# Patient Record
Sex: Female | Born: 1960 | Race: White | Hispanic: No | Marital: Married
Health system: Southern US, Community
[De-identification: ages and names within clinical notes are randomized; demographics above are authoritative.]

---

## 2013-03-29 ENCOUNTER — Inpatient Hospital Stay: Payer: Self-pay | Admitting: Internal Medicine

## 2013-03-29 LAB — URINALYSIS, COMPLETE
Blood: NEGATIVE
Nitrite: NEGATIVE
Ph: 5 (ref 4.5–8.0)
Specific Gravity: 1.025 (ref 1.003–1.030)

## 2013-03-29 LAB — CBC
HCT: 37.1 % (ref 35.0–47.0)
MCH: 41.3 pg — ABNORMAL HIGH (ref 26.0–34.0)
MCHC: 35.2 g/dL (ref 32.0–36.0)
MCV: 117 fL — ABNORMAL HIGH (ref 80–100)
RBC: 3.16 10*6/uL — ABNORMAL LOW (ref 3.80–5.20)
RDW: 13.1 % (ref 11.5–14.5)
WBC: 8.5 10*3/uL (ref 3.6–11.0)

## 2013-03-29 LAB — ETHANOL: Ethanol %: 0.01 % (ref 0.000–0.080)

## 2013-03-29 LAB — COMPREHENSIVE METABOLIC PANEL
Albumin: 2.8 g/dL — ABNORMAL LOW (ref 3.4–5.0)
Alkaline Phosphatase: 380 U/L — ABNORMAL HIGH (ref 50–136)
Anion Gap: 15 (ref 7–16)
Bilirubin,Total: 2.8 mg/dL — ABNORMAL HIGH (ref 0.2–1.0)
Calcium, Total: 8.4 mg/dL — ABNORMAL LOW (ref 8.5–10.1)
Chloride: 96 mmol/L — ABNORMAL LOW (ref 98–107)
Creatinine: 0.77 mg/dL (ref 0.60–1.30)
EGFR (African American): >60
EGFR (Non-African Amer.): >60
Osmolality: 272 (ref 275–301)
Potassium: 2.9 mmol/L — ABNORMAL LOW (ref 3.5–5.1)
SGPT (ALT): 115 U/L — ABNORMAL HIGH (ref 12–78)
Total Protein: 6.8 g/dL (ref 6.4–8.2)

## 2013-03-29 LAB — LIPASE, BLOOD: Lipase: 1138 U/L — ABNORMAL HIGH (ref 73–393)

## 2013-03-29 LAB — MAGNESIUM: Magnesium: 1.4 mg/dL — ABNORMAL LOW

## 2013-03-30 LAB — COMPREHENSIVE METABOLIC PANEL
Alkaline Phosphatase: 252 U/L — ABNORMAL HIGH (ref 50–136)
Anion Gap: 8 (ref 7–16)
Bilirubin,Total: 2.9 mg/dL — ABNORMAL HIGH (ref 0.2–1.0)
Calcium, Total: 7.3 mg/dL — ABNORMAL LOW (ref 8.5–10.1)
EGFR (African American): >60
Glucose: 82 mg/dL (ref 65–99)
SGPT (ALT): 72 U/L (ref 12–78)

## 2013-03-30 LAB — CBC WITH DIFFERENTIAL/PLATELET
Basophil #: 0 10*3/uL (ref 0.0–0.1)
Eosinophil #: 0.2 10*3/uL (ref 0.0–0.7)
Eosinophil %: 3.1 %
MCH: 42.5 pg — ABNORMAL HIGH (ref 26.0–34.0)
MCV: 120 fL — ABNORMAL HIGH (ref 80–100)
Monocyte %: 5.9 %
Neutrophil %: 66.5 %
Platelet: 134 10*3/uL — ABNORMAL LOW (ref 150–440)
RDW: 13.1 % (ref 11.5–14.5)

## 2013-03-30 LAB — OCCULT BLOOD X 1 CARD TO LAB, STOOL: Occult Blood, Feces: POSITIVE

## 2013-03-30 LAB — MAGNESIUM: Magnesium: 2.8 mg/dL — ABNORMAL HIGH

## 2013-03-31 LAB — CBC WITH DIFFERENTIAL/PLATELET
Basophil %: 0.5 %
Eosinophil #: 0.2 10*3/uL (ref 0.0–0.7)
Eosinophil %: 4.3 %
HCT: 28.6 % — ABNORMAL LOW (ref 35.0–47.0)
HGB: 9.8 g/dL — ABNORMAL LOW (ref 12.0–16.0)
Lymphocyte #: 1.1 10*3/uL (ref 1.0–3.6)
MCH: 42 pg — ABNORMAL HIGH (ref 26.0–34.0)
MCHC: 34.2 g/dL (ref 32.0–36.0)
MCV: 123 fL — ABNORMAL HIGH (ref 80–100)
Monocyte #: 0.2 x10 3/mm (ref 0.2–0.9)
Monocyte %: 4.8 %
Neutrophil #: 3.2 10*3/uL (ref 1.4–6.5)
Neutrophil %: 67.4 %
Platelet: 146 10*3/uL — ABNORMAL LOW (ref 150–440)
RDW: 13 % (ref 11.5–14.5)
WBC: 4.7 10*3/uL (ref 3.6–11.0)

## 2013-03-31 LAB — MAGNESIUM: Magnesium: 2 mg/dL

## 2013-03-31 LAB — LIPASE, BLOOD: Lipase: 426 U/L — ABNORMAL HIGH (ref 73–393)

## 2013-04-01 LAB — CBC WITH DIFFERENTIAL/PLATELET
Basophil #: 0 10*3/uL (ref 0.0–0.1)
Eosinophil #: 0.1 10*3/uL (ref 0.0–0.7)
HCT: 25 % — ABNORMAL LOW (ref 35.0–47.0)
HGB: 8.5 g/dL — ABNORMAL LOW (ref 12.0–16.0)
Lymphocyte #: 0.8 10*3/uL — ABNORMAL LOW (ref 1.0–3.6)
Lymphocyte %: 20.6 %
MCH: 40.9 pg — ABNORMAL HIGH (ref 26.0–34.0)
MCHC: 33.9 g/dL (ref 32.0–36.0)
MCV: 120 fL — ABNORMAL HIGH (ref 80–100)
Monocyte #: 0.2 x10 3/mm (ref 0.2–0.9)
Neutrophil #: 2.6 10*3/uL (ref 1.4–6.5)
Neutrophil %: 70 %
Platelet: 131 10*3/uL — ABNORMAL LOW (ref 150–440)
RBC: 2.08 10*6/uL — ABNORMAL LOW (ref 3.80–5.20)

## 2013-04-01 LAB — COMPREHENSIVE METABOLIC PANEL
Albumin: 1.8 g/dL — ABNORMAL LOW (ref 3.4–5.0)
Anion Gap: 8 (ref 7–16)
Bilirubin,Total: 2.1 mg/dL — ABNORMAL HIGH (ref 0.2–1.0)
Calcium, Total: 6.7 mg/dL — CL (ref 8.5–10.1)
Chloride: 109 mmol/L — ABNORMAL HIGH (ref 98–107)
Co2: 23 mmol/L (ref 21–32)
Creatinine: 0.53 mg/dL — ABNORMAL LOW (ref 0.60–1.30)
EGFR (African American): >60
EGFR (Non-African Amer.): >60
Glucose: 75 mg/dL (ref 65–99)
Osmolality: 275 (ref 275–301)
Potassium: 3.1 mmol/L — ABNORMAL LOW (ref 3.5–5.1)
SGOT(AST): 124 U/L — ABNORMAL HIGH (ref 15–37)
Sodium: 140 mmol/L (ref 136–145)
Total Protein: 4.4 g/dL — ABNORMAL LOW (ref 6.4–8.2)

## 2013-04-02 LAB — CBC WITH DIFFERENTIAL/PLATELET
Basophil #: 0 10*3/uL (ref 0.0–0.1)
Eosinophil %: 4.4 %
HCT: 27.8 % — ABNORMAL LOW (ref 35.0–47.0)
HGB: 9.4 g/dL — ABNORMAL LOW (ref 12.0–16.0)
Lymphocyte %: 17.9 %
MCHC: 33.8 g/dL (ref 32.0–36.0)
MCV: 121 fL — ABNORMAL HIGH (ref 80–100)
Monocyte #: 0.3 x10 3/mm (ref 0.2–0.9)
Monocyte %: 8.3 %
Neutrophil #: 2.4 10*3/uL (ref 1.4–6.5)
RBC: 2.3 10*6/uL — ABNORMAL LOW (ref 3.80–5.20)

## 2013-04-02 LAB — URINALYSIS, COMPLETE
Bacteria: NONE SEEN
Blood: NEGATIVE
Leukocyte Esterase: NEGATIVE
Ph: 6 (ref 4.5–8.0)
Protein: NEGATIVE
Squamous Epithelial: 1
WBC UR: <1 /HPF (ref 0–5)

## 2013-04-02 LAB — BASIC METABOLIC PANEL
Calcium, Total: 7.2 mg/dL — ABNORMAL LOW (ref 8.5–10.1)
Co2: 21 mmol/L (ref 21–32)
EGFR (African American): >60
EGFR (Non-African Amer.): >60
Osmolality: 279 (ref 275–301)
Potassium: 3.4 mmol/L — ABNORMAL LOW (ref 3.5–5.1)

## 2013-04-02 LAB — PATHOLOGY REPORT

## 2013-04-02 LAB — MAGNESIUM: Magnesium: 1.5 mg/dL — ABNORMAL LOW

## 2013-06-13 ENCOUNTER — Inpatient Hospital Stay: Payer: Self-pay | Admitting: Internal Medicine

## 2013-06-13 LAB — CBC
MCHC: 35.3 g/dL (ref 32.0–36.0)
MCV: 122 fL — ABNORMAL HIGH (ref 80–100)
RDW: 15.3 % — ABNORMAL HIGH (ref 11.5–14.5)

## 2013-06-13 LAB — COMPREHENSIVE METABOLIC PANEL
Albumin: 2.2 g/dL — ABNORMAL LOW (ref 3.4–5.0)
Alkaline Phosphatase: 348 U/L — ABNORMAL HIGH (ref 50–136)
BUN: 3 mg/dL — ABNORMAL LOW (ref 7–18)
Calcium, Total: 8.2 mg/dL — ABNORMAL LOW (ref 8.5–10.1)
Co2: 23 mmol/L (ref 21–32)
EGFR (African American): >60
Glucose: 87 mg/dL (ref 65–99)
Osmolality: 260 (ref 275–301)
SGPT (ALT): 42 U/L (ref 12–78)
Sodium: 132 mmol/L — ABNORMAL LOW (ref 136–145)
Total Protein: 6.9 g/dL (ref 6.4–8.2)

## 2013-06-13 LAB — URINALYSIS, COMPLETE
Bacteria: NONE SEEN
Bilirubin,UR: NEGATIVE
Blood: NEGATIVE
Leukocyte Esterase: NEGATIVE
Nitrite: NEGATIVE
Ph: 6 (ref 4.5–8.0)
RBC,UR: 1 /HPF (ref 0–5)
Specific Gravity: 1.002 (ref 1.003–1.030)

## 2013-06-13 LAB — DRUG SCREEN, URINE
Amphetamines, Ur Screen: NEGATIVE (ref ?–1000)
Benzodiazepine, Ur Scrn: NEGATIVE (ref ?–200)
Cannabinoid 50 Ng, Ur ~~LOC~~: NEGATIVE (ref ?–50)
MDMA (Ecstasy)Ur Screen: NEGATIVE (ref ?–500)
Opiate, Ur Screen: POSITIVE (ref ?–300)
Phencyclidine (PCP) Ur S: NEGATIVE (ref ?–25)
Tricyclic, Ur Screen: NEGATIVE (ref ?–1000)

## 2013-06-13 LAB — ETHANOL: Ethanol: 61 mg/dL

## 2013-06-13 LAB — TSH: Thyroid Stimulating Horm: 1.84 u[IU]/mL

## 2013-06-13 LAB — AMMONIA: Ammonia, Plasma: <25 mcmol/L (ref 11–32)

## 2013-06-14 LAB — BASIC METABOLIC PANEL
Anion Gap: 8 (ref 7–16)
BUN: 3 mg/dL — ABNORMAL LOW (ref 7–18)
Chloride: 105 mmol/L (ref 98–107)
Co2: 28 mmol/L (ref 21–32)
Creatinine: 0.52 mg/dL — ABNORMAL LOW (ref 0.60–1.30)
EGFR (Non-African Amer.): >60
Glucose: 91 mg/dL (ref 65–99)
Osmolality: 277 (ref 275–301)
Sodium: 141 mmol/L (ref 136–145)

## 2013-06-14 LAB — POTASSIUM: Potassium: 3.3 mmol/L — ABNORMAL LOW (ref 3.5–5.1)

## 2013-06-14 LAB — TRIGLYCERIDES: Triglycerides: 252 mg/dL — ABNORMAL HIGH (ref 0–200)

## 2013-06-14 LAB — COMPREHENSIVE METABOLIC PANEL
Alkaline Phosphatase: 239 U/L — ABNORMAL HIGH (ref 50–136)
SGOT(AST): 69 U/L — ABNORMAL HIGH (ref 15–37)
SGPT (ALT): 30 U/L (ref 12–78)

## 2013-06-14 LAB — MAGNESIUM: Magnesium: 1.7 mg/dL — ABNORMAL LOW

## 2013-06-14 LAB — PROTIME-INR: INR: 1.2

## 2013-06-15 LAB — COMPREHENSIVE METABOLIC PANEL
Alkaline Phosphatase: 248 U/L — ABNORMAL HIGH (ref 50–136)
Anion Gap: 9 (ref 7–16)
Bilirubin,Total: 1.4 mg/dL — ABNORMAL HIGH (ref 0.2–1.0)
Calcium, Total: 6.9 mg/dL — CL (ref 8.5–10.1)
Co2: 22 mmol/L (ref 21–32)
Creatinine: 0.4 mg/dL — ABNORMAL LOW (ref 0.60–1.30)
EGFR (Non-African Amer.): >60
Glucose: 74 mg/dL (ref 65–99)
Potassium: 3.1 mmol/L — ABNORMAL LOW (ref 3.5–5.1)
SGPT (ALT): 32 U/L (ref 12–78)

## 2013-06-15 LAB — CBC WITH DIFFERENTIAL/PLATELET
Basophil %: 0.7 %
HCT: 23.8 % — ABNORMAL LOW (ref 35.0–47.0)
Lymphocyte #: 1.4 10*3/uL (ref 1.0–3.6)
Lymphocyte %: 34.9 %
MCH: 42.7 pg — ABNORMAL HIGH (ref 26.0–34.0)
MCV: 126 fL — ABNORMAL HIGH (ref 80–100)
Monocyte #: 0.3 x10 3/mm (ref 0.2–0.9)
Monocyte %: 7 %
Neutrophil #: 2.1 10*3/uL (ref 1.4–6.5)
Neutrophil %: 53.3 %
RDW: 15.9 % — ABNORMAL HIGH (ref 11.5–14.5)

## 2013-06-15 LAB — MAGNESIUM: Magnesium: 2.2 mg/dL

## 2013-06-16 LAB — CBC WITH DIFFERENTIAL/PLATELET
Basophil #: 0 10*3/uL (ref 0.0–0.1)
Basophil %: 0.6 %
Eosinophil #: 0.1 10*3/uL (ref 0.0–0.7)
Eosinophil %: 2.9 %
HGB: 8.1 g/dL — ABNORMAL LOW (ref 12.0–16.0)
Lymphocyte #: 0.9 10*3/uL — ABNORMAL LOW (ref 1.0–3.6)
MCH: 42.6 pg — ABNORMAL HIGH (ref 26.0–34.0)
MCHC: 33.9 g/dL (ref 32.0–36.0)
MCV: 126 fL — ABNORMAL HIGH (ref 80–100)
Monocyte #: 0.3 x10 3/mm (ref 0.2–0.9)
Neutrophil #: 3 10*3/uL (ref 1.4–6.5)
Platelet: 191 10*3/uL (ref 150–440)
RBC: 1.9 10*6/uL — ABNORMAL LOW (ref 3.80–5.20)
WBC: 4.5 10*3/uL (ref 3.6–11.0)

## 2013-06-16 LAB — BASIC METABOLIC PANEL
Chloride: 112 mmol/L — ABNORMAL HIGH (ref 98–107)
Co2: 19 mmol/L — ABNORMAL LOW (ref 21–32)
Creatinine: 0.37 mg/dL — ABNORMAL LOW (ref 0.60–1.30)
EGFR (African American): >60
EGFR (Non-African Amer.): >60
Glucose: 66 mg/dL (ref 65–99)
Potassium: 3.9 mmol/L (ref 3.5–5.1)
Sodium: 141 mmol/L (ref 136–145)

## 2013-06-16 LAB — LIPASE, BLOOD: Lipase: 116 U/L (ref 73–393)

## 2013-06-16 LAB — MAGNESIUM: Magnesium: 2 mg/dL

## 2013-06-17 LAB — CBC WITH DIFFERENTIAL/PLATELET
Basophil #: 0 10*3/uL (ref 0.0–0.1)
Basophil %: 0.6 %
Eosinophil %: 3.3 %
Lymphocyte #: 0.8 10*3/uL — ABNORMAL LOW (ref 1.0–3.6)
MCHC: 34.5 g/dL (ref 32.0–36.0)
MCV: 125 fL — ABNORMAL HIGH (ref 80–100)
Monocyte %: 7.2 %
Neutrophil %: 70.8 %
RBC: 1.91 10*6/uL — ABNORMAL LOW (ref 3.80–5.20)

## 2013-06-17 LAB — URINALYSIS, COMPLETE
Nitrite: NEGATIVE
Protein: NEGATIVE
RBC,UR: 46 /HPF (ref 0–5)
Specific Gravity: 1.005 (ref 1.003–1.030)
Squamous Epithelial: NONE SEEN

## 2013-06-17 LAB — BASIC METABOLIC PANEL
Anion Gap: 10 (ref 7–16)
BUN: 1 mg/dL — ABNORMAL LOW (ref 7–18)
Chloride: 112 mmol/L — ABNORMAL HIGH (ref 98–107)
Creatinine: 0.43 mg/dL — ABNORMAL LOW (ref 0.60–1.30)
EGFR (African American): >60
Osmolality: 278 (ref 275–301)
Potassium: 3.6 mmol/L (ref 3.5–5.1)

## 2013-06-17 LAB — HEPATIC FUNCTION PANEL A (ARMC): SGPT (ALT): 31 U/L (ref 12–78)

## 2013-06-17 LAB — MAGNESIUM: Magnesium: 1.8 mg/dL

## 2013-06-17 LAB — CANCER ANTIGEN 19-9: CA 19-9: 196 U/mL — ABNORMAL HIGH (ref 0–35)

## 2013-06-17 LAB — CEA: CEA: 8.7 ng/mL — ABNORMAL HIGH (ref 0.0–4.7)

## 2013-06-18 LAB — BASIC METABOLIC PANEL
Anion Gap: 7 (ref 7–16)
Calcium, Total: 8.2 mg/dL — ABNORMAL LOW (ref 8.5–10.1)
Co2: 24 mmol/L (ref 21–32)
Creatinine: 0.53 mg/dL — ABNORMAL LOW (ref 0.60–1.30)
EGFR (African American): >60
EGFR (Non-African Amer.): >60
Glucose: 95 mg/dL (ref 65–99)
Potassium: 4.2 mmol/L (ref 3.5–5.1)
Sodium: 140 mmol/L (ref 136–145)

## 2013-06-19 LAB — COMPREHENSIVE METABOLIC PANEL
Albumin: 1.7 g/dL — ABNORMAL LOW (ref 3.4–5.0)
Alkaline Phosphatase: 241 U/L — ABNORMAL HIGH (ref 50–136)
Anion Gap: 8 (ref 7–16)
BUN: 1 mg/dL — ABNORMAL LOW (ref 7–18)
Creatinine: 0.63 mg/dL (ref 0.60–1.30)
Osmolality: 282 (ref 275–301)
Potassium: 3.9 mmol/L (ref 3.5–5.1)
SGOT(AST): 69 U/L — ABNORMAL HIGH (ref 15–37)
SGPT (ALT): 31 U/L (ref 12–78)
Total Protein: 5.6 g/dL — ABNORMAL LOW (ref 6.4–8.2)

## 2013-06-20 LAB — URINE CULTURE

## 2013-06-22 LAB — BASIC METABOLIC PANEL
Anion Gap: 6 — ABNORMAL LOW (ref 7–16)
BUN: 4 mg/dL — ABNORMAL LOW (ref 7–18)
Calcium, Total: 8.1 mg/dL — ABNORMAL LOW (ref 8.5–10.1)
Chloride: 110 mmol/L — ABNORMAL HIGH (ref 98–107)
Co2: 26 mmol/L (ref 21–32)
Creatinine: 0.67 mg/dL (ref 0.60–1.30)
EGFR (African American): >60
EGFR (Non-African Amer.): >60
Potassium: 3.4 mmol/L — ABNORMAL LOW (ref 3.5–5.1)
Sodium: 142 mmol/L (ref 136–145)

## 2013-06-22 LAB — CBC WITH DIFFERENTIAL/PLATELET
Basophil #: 0.1 10*3/uL (ref 0.0–0.1)
Basophil %: 1.1 %
HGB: 9.8 g/dL — ABNORMAL LOW (ref 12.0–16.0)
Lymphocyte #: 1.3 10*3/uL (ref 1.0–3.6)
Lymphocyte %: 28.2 %
MCH: 41.9 pg — ABNORMAL HIGH (ref 26.0–34.0)
MCHC: 34.2 g/dL (ref 32.0–36.0)
RBC: 2.35 10*6/uL — ABNORMAL LOW (ref 3.80–5.20)
RDW: 15.2 % — ABNORMAL HIGH (ref 11.5–14.5)

## 2013-06-24 LAB — URINALYSIS, COMPLETE
Bacteria: NONE SEEN
Blood: NEGATIVE
Glucose,UR: NEGATIVE mg/dL (ref 0–75)
Leukocyte Esterase: NEGATIVE
Nitrite: NEGATIVE
Ph: 6 (ref 4.5–8.0)
Protein: NEGATIVE
RBC,UR: NONE SEEN /HPF (ref 0–5)
Specific Gravity: 1.008 (ref 1.003–1.030)

## 2013-06-25 ENCOUNTER — Inpatient Hospital Stay: Payer: Self-pay | Admitting: Psychiatry

## 2014-10-14 IMAGING — CT CT ABDOMEN W/ CM
1 of 3 series · 12 of 32 positions shown, 18 images · non-contrast
Comparison: none

REASON FOR EXAM: pancreatic mass;    NOTE: Nursing to Give Oral CT
Contrast
COMMENTS:

[Series 2: 3mm soft tissue · axial · 0.68mm/px · z∈[+122,+404]mm · 12 of 110 slices shown, 18 images]
[im 8/110  soft-tissue]
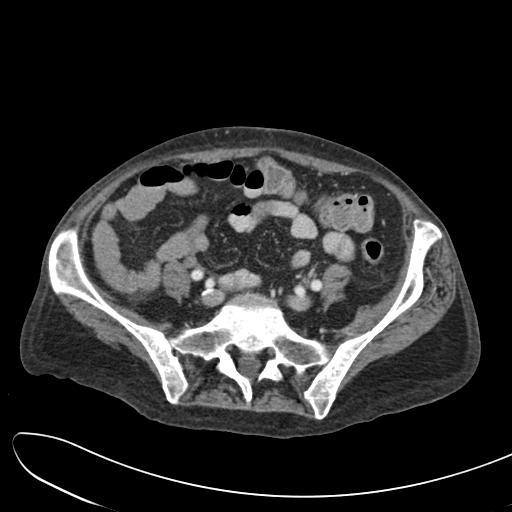
[im 8/110  bone]
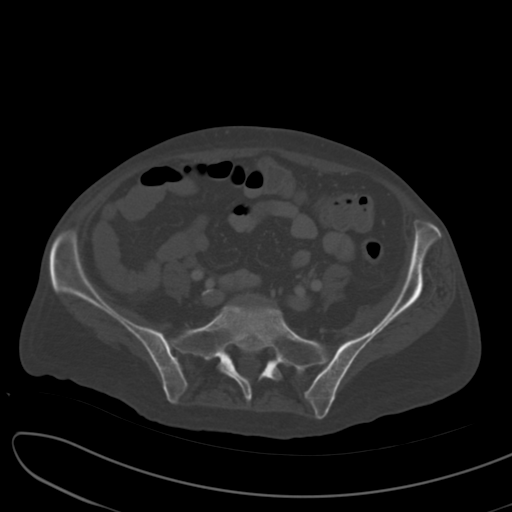
[im 16/110  soft-tissue]
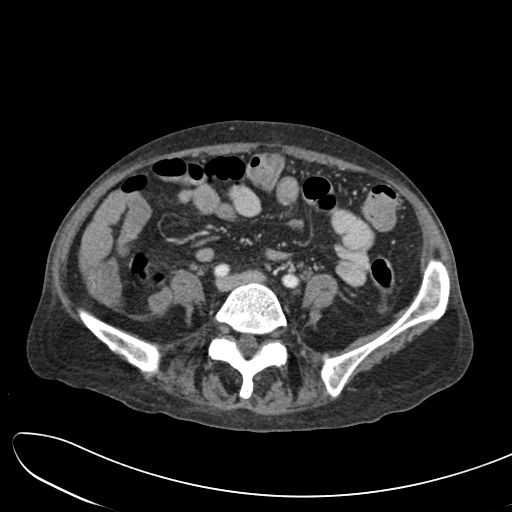
[im 24/110  soft-tissue]
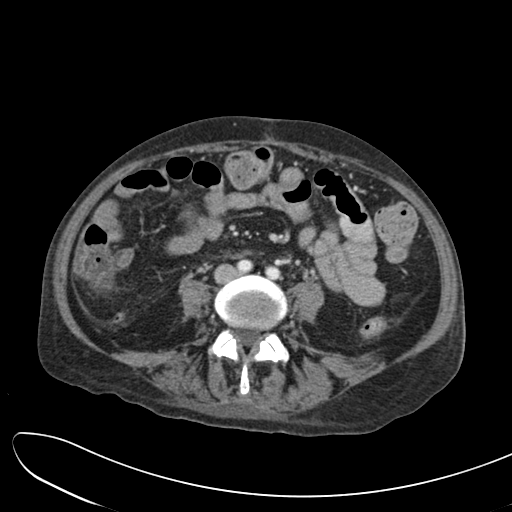
[im 32/110  soft-tissue]
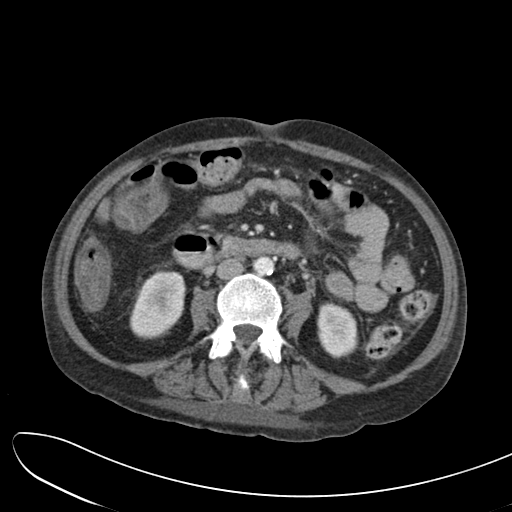
[im 39/110  soft-tissue]
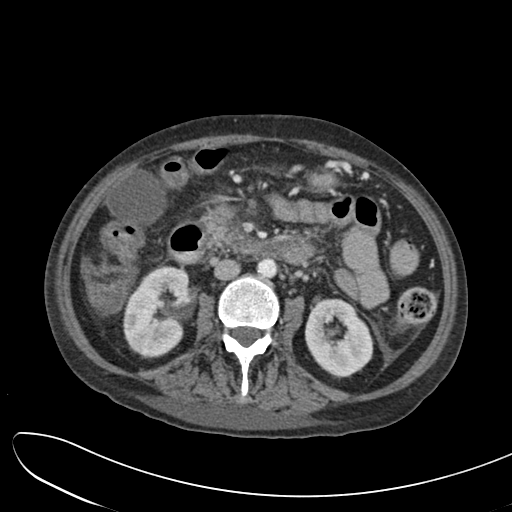
[im 47/110  soft-tissue]
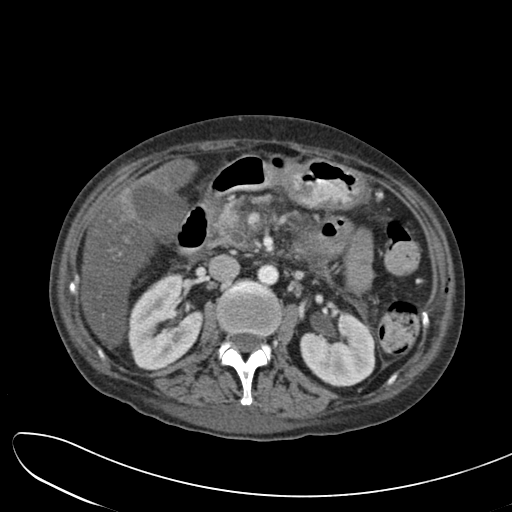
[im 63/110  soft-tissue]
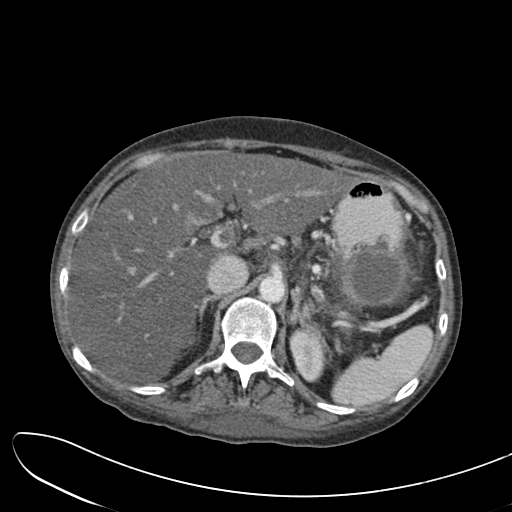
[im 71/110  soft-tissue]
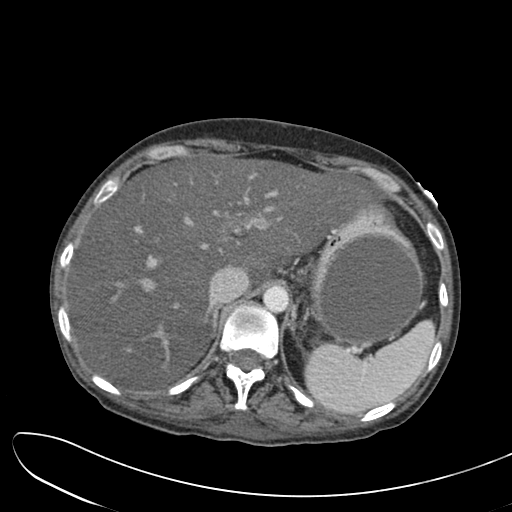
[im 78/110  soft-tissue]
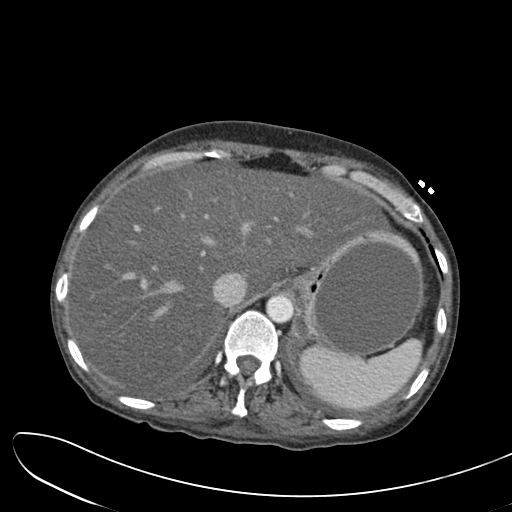
[im 78/110  lung]
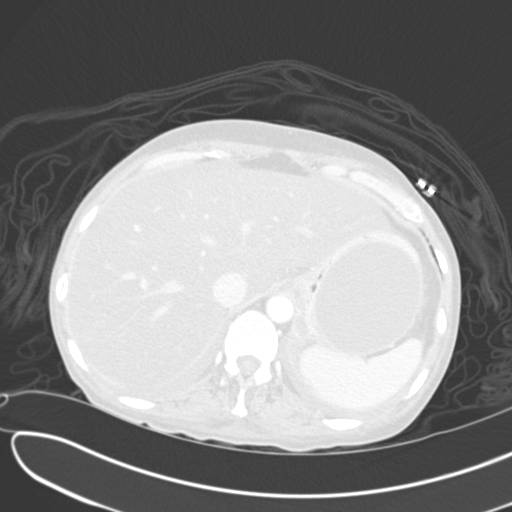
[im 78/110  bone]
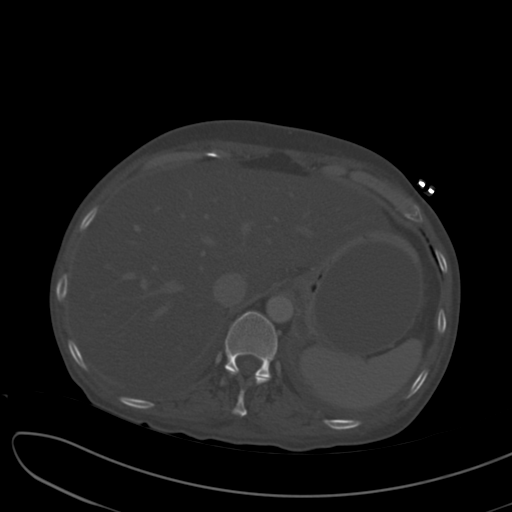
[im 86/110  soft-tissue]
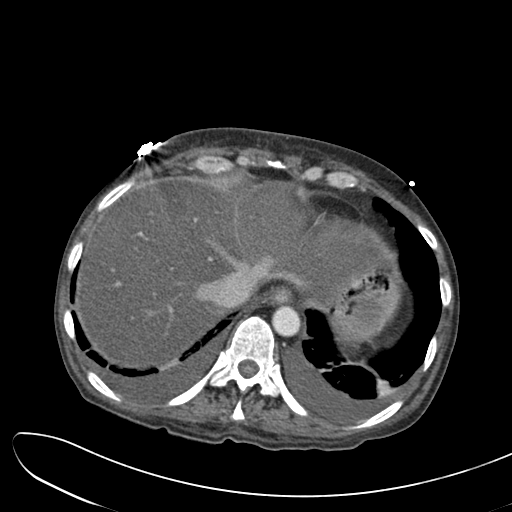
[im 86/110  lung]
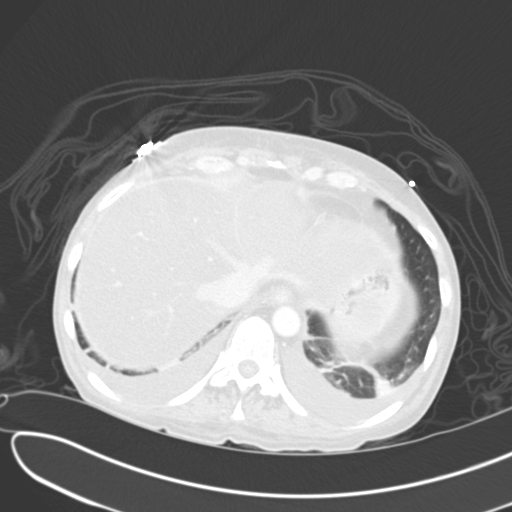
[im 94/110  soft-tissue]
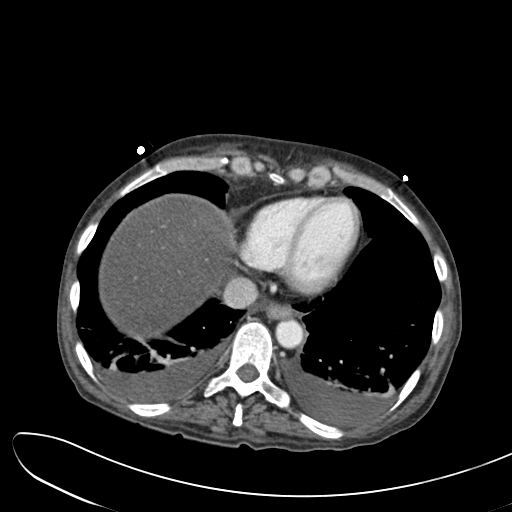
[im 94/110  lung]
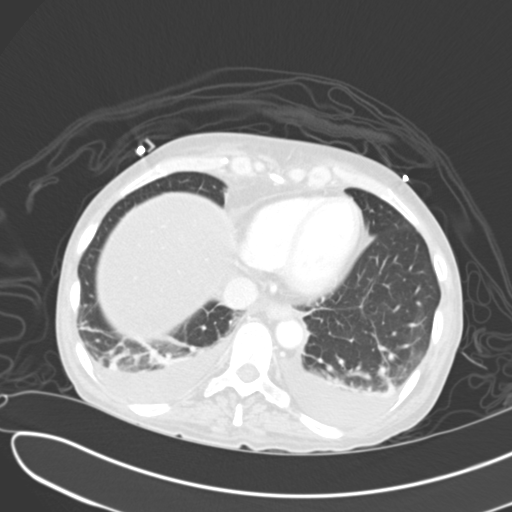
[im 102/110  soft-tissue]
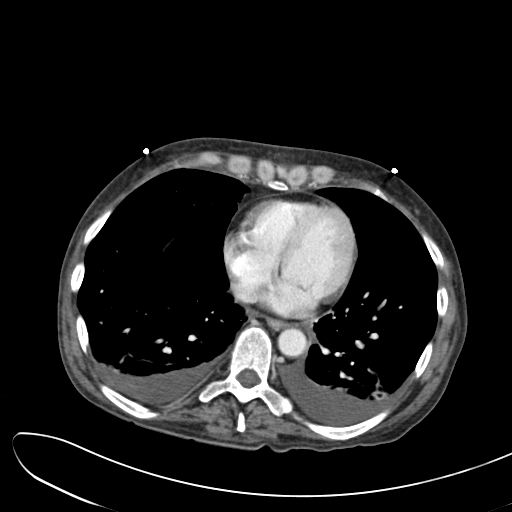
[im 102/110  lung]
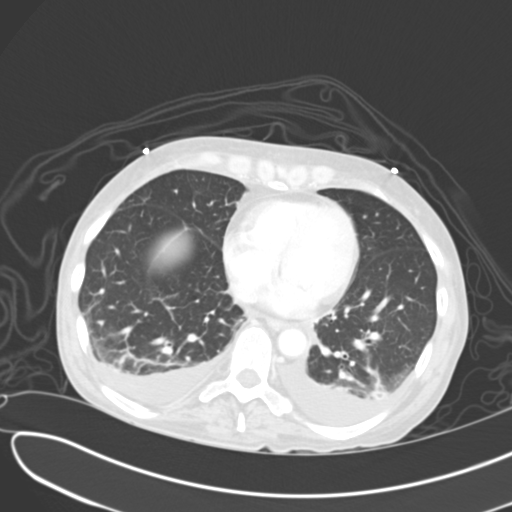

[12 of 32 positions shown; findings below may reference images not displayed]

PROCEDURE:     CT  - CT ABDOMEN STANDARD W  - June 15, 2013 [DATE]

RESULT:     The patient underwent abdominal ultrasound examination on 14 June, 2013. At that time a questionable pancreatic tail mass was
demonstrated.

Axial CT scanning was performed through the abdomen with reconstructions at
3 mm intervals and slice thicknesses. The patient was unable to tolerate
more than a small amount of oral contrast material. The patient did receive
100 cc of Isovue 300 intravenously.

There is an abnormal cystic appearing mass associated with the posterior
aspect of the body of the stomach. This measures 8.8 cm in greatest
dimension and has a thickened rind. This may be in fact arising from the
pancreatic tail and involving the adjacent stomach. There are smaller
similar appearing cystic masses in the body and head of the pancreas. The
pancreatic body mass measures 2.8 cm in diameter. Two pancreatic head cystic
appearing masses are demonstrated and measure 1.6 and 1 cm respectively.
There is mildly increased density in the peripancreatic fat with small
amounts of peripancreatic fluid. The findings are consistent with both acute
and chronic pancreatitis.

The liver exhibits profoundly decreased density consistent with fatty
infiltration. The gallbladder is adequately distended and contains an
approximately 2 cm diameter faintly rim calcified stone. There is no
pericholecystic fluid or gallbladder wall thickening. The spleen is normal
in size. The adrenal glands and kidneys are normal in appearance.

The caliber of the abdominal aorta is normal. There is no periaortic nor
pericaval lymphadenopathy. The observed portions of the small and large
bowel exhibit no evidence of ileus or obstruction or acute inflammation. A
normal calibered, uninflamed-appearing appendix is demonstrated.

There are small to moderate sized bilateral pleural effusions layering
posteriorly. There is bibasilar atelectasis as well. The cardiac chambers
where visualized appear normal. The lumbar vertebral bodies are preserved in
height.
IMPRESSION: 1. There are multiple cystic appearing masses within the pancreas as
described. These likely reflect pseudocysts related to previous episodes of
acute pancreatitis. The largest is associated with the pancreatic tail and
extends into the posterior aspect of the body of the stomach. There is
mildly increased peripancreatic fat density presently and there are small
amounts of peripancreatic fluid.
2. There is fatty infiltrative change of the liver. No hepatic masses are
demonstrated.
3. The gallbladder is mildly distended and contains a nearly 2 cm diameter
faintly rim calcified stone.
4. There is no acute urinary tract abnormality nor acute bowel abnormality.
5. There are small to moderate sized bilateral pleural effusions with
bibasilar atelectasis.

[REDACTED]

## 2015-02-13 NOTE — Consult Note (Signed)
Chief Complaint:  Subjective/Chief Complaint seen for pancreatitis.  denies n/v or abdo pain. poor appetite, not eatign tray, depressed affect.   VITAL SIGNS/ANCILLARY NOTES: **Vital Signs.:   25-Aug-14 13:30  Vital Signs Type Routine  Celsius 37  Temperature Source oral  Pulse Pulse 106  Respirations Respirations 18  Systolic BP Systolic BP 90  Diastolic BP (mmHg) Diastolic BP (mmHg) 56  Mean BP 67  Pulse Ox % Pulse Ox % 94  Pulse Ox Activity Level  At rest  Oxygen Delivery Room Air/ 21 %   Brief Assessment:  Cardiac Regular   Respiratory clear BS   Gastrointestinal details normal Soft  Nontender  Nondistended  No masses palpable  Bowel sounds normal   Lab Results: Hepatic:  23-Aug-14 03:09   SGPT (ALT) 32  25-Aug-14 03:41   Bilirubin, Total  1.4  Bilirubin, Direct  1.1 (Result(s) reported on 17 Jun 2013 at 04:55AM.)  Alkaline Phosphatase  220  SGPT (ALT) 31  SGOT (AST)  76  Total Protein, Serum  4.8  Albumin, Serum  1.5  Oncology:  23-Aug-14 03:09   Carbohydrate Antigen 19-9  196 ([facility] methodology            [facility]            No: [id number]           [address], [place], [place] [address]           Lindon Romp, MD         [phone] Result(s) reported on 17 Jun 2013 at 04:17PM.)  Carcinoembryonic Antigen (CEA)  8.7 (Roche ECLIA methodology       Nonsmokers  <3.9                                                     Smokers     <5.6            [facility]            No: [id number]           [address] [place], [place] [address]           Lindon Romp, MD         [phone] Result(s) reported on 17 Jun 2013 at 04:17PM.)  Carcinoembryonic Antigen ========== TEST NAME ==========  ========= RESULTS =========  = REFERENCE RANGE =  CARCINOEMBRYONIC AB   Routine Chem:  24-Aug-14 05:03   Lipase 116 (Result(s) reported on 16 Jun 2013 at 05:51AM.)  25-Aug-14 03:41   Glucose, Serum 75  BUN  1  Creatinine  (comp)  0.43  Sodium, Serum 142  Potassium, Serum 3.6  Chloride, Serum  112  CO2, Serum  20  Calcium (Total), Serum  7.6  Anion Gap 10  Osmolality (calc) 278  eGFR (African American) >60  eGFR (Non-African American) >60 (eGFR values <66m/min/1.73 m2 may be an indication of chronic kidney disease (CKD). Calculated eGFR is useful in patients with stable renal function. The eGFR calculation will not be reliable in acutely ill patients when serum creatinine is changing rapidly. It is not useful in  patients on dialysis. The eGFR calculation may not be applicable to patients at the low and high extremes of body sizes, pregnant women, and vegetarians.)  Magnesium, Serum 1.8 (1.8-2.4 THERAPEUTIC RANGE: 4-7 mg/dL TOXIC: >  10 mg/dL  -----------------------)  Routine UA:  25-Aug-14 05:12   Color (UA) Yellow  Clarity (UA) Cloudy  Glucose (UA) Negative  Bilirubin (UA) Negative  Ketones (UA) 1+  Specific Gravity (UA) 1.005  Blood (UA) 3+  pH (UA) 5.0  Protein (UA) Negative  Nitrite (UA) Negative  Leukocyte Esterase (UA) 3+ (Result(s) reported on 17 Jun 2013 at 05:35AM.)  RBC (UA) 46 /HPF  WBC (UA) 397 /HPF  Bacteria (UA) 2+  Epithelial Cells (UA) NONE SEEN  WBC Clump (UA) PRESENT  Mucous (UA) PRESENT (Result(s) reported on 17 Jun 2013 at 05:35AM.)  Routine Hem:  25-Aug-14 03:41   WBC (CBC) 4.6  RBC (CBC)  1.91  Hemoglobin (CBC)  8.3  Hematocrit (CBC)  24.0  Platelet Count (CBC) 177  MCV  125  MCH  43.2  MCHC 34.5  RDW  15.5  Neutrophil % 70.8  Lymphocyte % 18.1  Monocyte % 7.2  Eosinophil % 3.3  Basophil % 0.6  Neutrophil # 3.2  Lymphocyte #  0.8  Monocyte # 0.3  Eosinophil # 0.2  Basophil # 0.0 (Result(s) reported on 17 Jun 2013 at 04:40AM.)   Radiology Results: CT:    23-Aug-14 11:06, CT Abdomen With Contrast  CT Abdomen With Contrast   REASON FOR EXAM:    pancreatic mass;    NOTE: Nursing to Give Oral CT   Contrast  COMMENTS:       PROCEDURE: CT  - CT  ABDOMEN STANDARD W  - Jun 15 2013 11:06AM     RESULT: The patient underwent abdominal ultrasound examination on 14 June 2013. Atthat time a questionable pancreatic tail mass was   demonstrated.    Axial CT scanning was performed through the abdomen with reconstructions   at 3 mm intervals and slice thicknesses. The patient was unable to   tolerate more than a small amount of oral contrast material. The patient   did receive 100 cc of Isovue 300 intravenously.  There is an abnormal cystic appearing mass associated with the posterior   aspect of the body of the stomach. This measures 8.8 cm in greatest   dimension and has athickened rind. This may be in fact arising from the   pancreatic tail and involving the adjacent stomach. There are smaller   similar appearing cystic masses in the body and head of the pancreas. The   pancreatic body mass measures 2.8 cm in diameter. Two pancreatic head   cystic appearing masses are demonstrated and measure 1.6 and 1 cm   respectively. There is mildly increased density in the peripancreatic fat   with small amounts of peripancreatic fluid. The findings are consistent   with both acute and chronic pancreatitis.    The liver exhibits profoundly decreased density consistent with fatty   infiltration. The gallbladder is adequately distended and contains an   approximately 2 cm diameter faintly rim calcified stone. There is no   pericholecystic fluid or gallbladder wall thickening. The spleen is     normal in size. The adrenal glands and kidneys are normal in appearance.    The caliber of the abdominal aorta is normal. There is no periaortic nor   pericaval lymphadenopathy. The observed portions of the small and large   bowel exhibit no evidence of ileus or obstruction or acute inflammation.   A normal calibered, uninflamed-appearing appendix is demonstrated.    There are small to moderate sized bilateral pleural effusions layering   posteriorly.  There is bibasilar  atelectasis as well. The cardiac chambers   where visualized appear normal. The lumbar vertebral bodies are preserved   in height.    IMPRESSION:   1. There are multiple cystic appearing masses within the pancreas as   described. 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 The largest is associated with the pancreatic tail   and extends into the posterior aspect of the body of the stomach. There   is mildly increased peripancreatic fat density presently and there are   small amounts of peripancreatic fluid.  2. There is fatty infiltrative change of the liver. No hepatic masses are   demonstrated.  3. The gallbladder is mildly distended and contains a nearly 2 cm   diameter faintly rim calcified stone.  4. There is no acute urinary tract abnormality nor acute bowel   abnormality.  5. There are small to moderate sized bilateral pleural effusions with   bibasilar atelectasis.     Dictation Site: 5    Verified By: DAVID A. Martinique, M.D., MD   Assessment/Plan:  Assessment/Plan:  Assessment 1) acute pancreatitis-biochemically and symptomatically improved.  several cystic structures noted on CT, elevated ca19-9 and cea noted.  Patient will need to have fu in these regards-will need EUS for further evaluation of the pancreas, with serial tumor markers to resolution/improvement of lab or other diagnosis.  Patietn states she had a colonoscopy about 5 years ago, denies h/o polyps.  Will need colonoscopy, can be done as outpatient.   Plan 1) further evaluation as above, however she is apparently wanting to move back to [place] soon and evaluation will need to be finished there. I will be happy to see her in o/p fu if she decides to remain local.   Electronic Signatures: Loistine Simas (MD)  (Signed 25-Aug-14 19:36)  Authored: Chief Complaint, VITAL SIGNS/ANCILLARY NOTES, Brief Assessment, Lab Results, Radiology Results,  Assessment/Plan   Last Updated: 25-Aug-14 19:36 by Loistine Simas (MD)

## 2015-02-13 NOTE — Consult Note (Signed)
Chief Complaint:  Subjective/Chief Complaint seen for pancreatitis.  patient in some mild etoh withdrawl-oriented 2/3, denies abd pain nausea or vomiting.   VITAL SIGNS/ANCILLARY NOTES: **Vital Signs.:   26-Aug-14 13:38  Vital Signs Type Routine  Temperature Temperature (F) 98.4  Celsius 36.8  Temperature Source oral  Pulse Pulse 102  Respirations Respirations 18  Systolic BP Systolic BP 82  Diastolic BP (mmHg) Diastolic BP (mmHg) 59  Mean BP 66  Pulse Ox % Pulse Ox % 95  Pulse Ox Activity Level  At rest  Oxygen Delivery Room Air/ 21 %   Brief Assessment:  Cardiac Regular   Respiratory clear BS   Gastrointestinal details normal Soft  Nontender  Nondistended  Bowel sounds normal   Lab Results: Routine Chem:  26-Aug-14 05:16   Glucose, Serum 95  BUN  < 1  Creatinine (comp)  0.53  Sodium, Serum 140  Potassium, Serum 4.2  Chloride, Serum  109  CO2, Serum 24  Calcium (Total), Serum  8.2  Anion Gap 7  Osmolality (calc) SEE COMMENT  eGFR (African American) >60  eGFR (Non-African American) >60 (eGFR values <50m/min/1.73 m2 may be an indication of chronic kidney disease (CKD). Calculated eGFR is useful in patients with stable renal function. The eGFR calculation will not be reliable in acutely ill patients when serum creatinine is changing rapidly. It is not useful in  patients on dialysis. The eGFR calculation may not be applicable to patients at the low and high extremes of body sizes, pregnant women, and vegetarians.)  Result Comment OSMOLALITY - UNABLE TO CALCULATE DUE TO LOW VALUE  - OF BUN RESULT  Result(s) reported on 18 Jun 2013 at 06:22AM.   Radiology Results: CT:    23-Aug-14 11:06, CT Abdomen With Contrast  CT Abdomen With Contrast   REASON FOR EXAM:    pancreatic mass;    NOTE: Nursing to Give Oral CT   Contrast  COMMENTS:       PROCEDURE: CT  - CT ABDOMEN STANDARD W  - Jun 15 2013 11:06AM     RESULT: The patient underwent abdominal ultrasound  examination on 14 June 2013. Atthat time a questionable pancreatic tail mass was   demonstrated.    Axial CT scanning was performed through the abdomen with reconstructions   at 3 mm intervals and slice thicknesses. The patient was unable to   tolerate more than a small amount of oral contrast material. The patient   did receive 100 cc of Isovue 300 intravenously.  There is an abnormal cystic appearing mass associated with the posterior   aspect of the body of the stomach. This measures 8.8 cm in greatest   dimension and has athickened rind. This may be in fact arising from the   pancreatic tail and involving the adjacent stomach. There are smaller   similar appearing cystic masses in the body and head of the pancreas. The   pancreatic body mass measures 2.8 cm in diameter. Two pancreatic head   cystic appearing masses are demonstrated and measure 1.6 and 1 cm   respectively. There is mildly increased density in the peripancreatic fat   with small amounts of peripancreatic fluid. The findings are consistent   with both acute and chronic pancreatitis.    The liver exhibits profoundly decreased density consistent with fatty   infiltration. The gallbladder is adequately distended and contains an   approximately 2 cm diameter faintly rim calcified stone. There is no   pericholecystic fluid or gallbladder  wall thickening. The spleen is     normal in size. The adrenal glands and kidneys are normal in appearance.    The caliber of the abdominal aorta is normal. There is no periaortic nor   pericaval lymphadenopathy. The observed portions of the small and large   bowel exhibit no evidence of ileus or obstruction or acute inflammation.   A normal calibered, uninflamed-appearing appendix is demonstrated.    There are small to moderate sized bilateral pleural effusions layering   posteriorly. There is bibasilar atelectasis as well. The cardiac chambers   where visualized appear normal. The  lumbar vertebral bodies are preserved   in height.    IMPRESSION:   1. There are multiple cystic appearing masses within the pancreas as   described. 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 The largest is associated with the pancreatic tail   and extends into the posterior aspect of the body of the stomach. There   is mildly increased peripancreatic fat density presently and there are   small amounts of peripancreatic fluid.  2. There is fatty infiltrative change of the liver. No hepatic masses are   demonstrated.  3. The gallbladder is mildly distended and contains a nearly 2 cm   diameter faintly rim calcified stone.  4. There is no acute urinary tract abnormality nor acute bowel   abnormality.  5. There are small to moderate sized bilateral pleural effusions with   bibasilar atelectasis.     Dictation Site: 5    Verified By: DAVID A. Martinique, M.D., MD   Assessment/Plan:  Assessment/Plan:  Assessment 1) pancreatitis, probable etoh related.  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  Most likely the former, however also noted with elevated ca19-9 and cea.  Will need further evaluation once past etoh w/d, however I am unsure as to whether this needs done local or in [place] where she is intending to move shortly.  will need to discuss further with Dr James Ivanoff.   Plan as above.   Electronic Signatures: Loistine Simas (MD)  (Signed 26-Aug-14 16:01)  Authored: Chief Complaint, VITAL SIGNS/ANCILLARY NOTES, Brief Assessment, Lab Results, Radiology Results, Assessment/Plan   Last Updated: 26-Aug-14 16:01 by Loistine Simas (MD)

## 2015-02-13 NOTE — Consult Note (Signed)
Brief Consult Note: Diagnosis: pancreatitis, likely etoh related.   Patient was seen by consultant.   Consult note dictated.   Recommend further assessment or treatment.   Orders entered.   Comments: Please see full GI consult [phone] and [phone].  Patient admitted with AMS found with abnormal lfts and multiple cystic pancreatic lesions.  Initial impression is chronic pancreatitis with some pseudocyst formation, but cystic neoplasm must also be considered.  Will obtain ca19-9 and cea.  Patietn currently in withdrawl?, with sedation and continued ams, unable to get detailed history from patient. Following.   MRCP may be of assistance to help determine pancreatic ductal anatomy under current circumstances, but will await results of above..  Electronic Signatures: Barnetta ChapelSkulskie, Fidelis Loth (MD)  (Signed 23-Aug-14 13:17)  Authored: Brief Consult Note   Last Updated: 23-Aug-14 13:17 by Barnetta ChapelSkulskie, Genessa Beman (MD)

## 2015-02-13 NOTE — Consult Note (Signed)
Chief Complaint:  Subjective/Chief Complaint seen for acute pancreatitis.  mild nausea, no emesis.  more alert today, able to answer questions.  daily etoh use.  history of celiac sprue, copd.  passing flatus. main problem seems to be back pain currently. mild abdominal discomfort.   VITAL SIGNS/ANCILLARY NOTES: **Vital Signs.:   24-Aug-14 14:09  Vital Signs Type Routine  Temperature Temperature (F) 98.3  Celsius 36.8  Temperature Source oral  Pulse Pulse 92  Respirations Respirations 20  Systolic BP Systolic BP 83  Diastolic BP (mmHg) Diastolic BP (mmHg) 53  Mean BP 63  Pulse Ox % Pulse Ox % 95  Pulse Ox Activity Level  At rest  Oxygen Delivery Room Air/ 21 %   Brief Assessment:  Cardiac Regular   Respiratory clear BS   Gastrointestinal details normal Soft  Nondistended  No masses palpable  bs positive, mild epigastric discomfort to palpation   Lab Results: Routine Chem:  21-Aug-14 16:54   Lipase  1144 (Result(s) reported on 13 Jun 2013 at 08:58PM.)  22-Aug-14 08:51   Triglycerides, Serum  252 (Result(s) reported on 14 Jun 2013 at 09:36AM.)  23-Aug-14 03:09   Lipase 216 (Result(s) reported on 15 Jun 2013 at 07:58AM.)  24-Aug-14 05:03   Lipase 116 (Result(s) reported on 16 Jun 2013 at 05:51AM.)  Glucose, Serum 66  BUN  < 1  Creatinine (comp)  0.37  Sodium, Serum 141  Potassium, Serum 3.9  Chloride, Serum  112  CO2, Serum  19  Calcium (Total), Serum  7.2  Anion Gap 10  Osmolality (calc) SEE COMMENT  eGFR (African American) >60  eGFR (Non-African American) >60 (eGFR values <59m/min/1.73 m2 may be an indication of chronic kidney disease (CKD). Calculated eGFR is useful in patients with stable renal function. The eGFR calculation will not be reliable in acutely ill patients when serum creatinine is changing rapidly. It is not useful in  patients on dialysis. The eGFR calculation may not be applicable to patients at the low and high extremes of body sizes,  pregnant women, and vegetarians.)  Result Comment osmolality - UNABLE TO CALCULATE VALUE DUE TO NON-  - NUMERIC VALUE WITHIN THE CALCULATION.  Result(s) reported on 16 Jun 2013 at 05:44AM.  Magnesium, Serum 2.0 (1.8-2.4 THERAPEUTIC RANGE: 4-7 mg/dL TOXIC: > 10 mg/dL  -----------------------)  Routine Hem:  24-Aug-14 05:03   WBC (CBC) 4.5  RBC (CBC)  1.90  Hemoglobin (CBC)  8.1  Hematocrit (CBC)  23.9  Platelet Count (CBC) 191  MCV  126  MCH  42.6  MCHC 33.9  RDW  15.9  Neutrophil % 68.2  Lymphocyte % 21.1  Monocyte % 7.2  Eosinophil % 2.9  Basophil % 0.6  Neutrophil # 3.0  Lymphocyte #  0.9  Monocyte # 0.3  Eosinophil # 0.1  Basophil # 0.0 (Result(s) reported on 16 Jun 2013 at 05:44AM.)   Radiology Results: [place]    22-Aug-14 12:20, [place]  [place] Survey   REASON FOR EXAM:    pancreatitis elevated bili and alk phos  COMMENTS:   Body Site: GB and Fossa, CBD, Head of Pancreas    PROCEDURE: [place] - [place] LIMITED SURVEY  - Jun 14 2013 12:20PM     RESULT:     Findings: The liver demonstrates a dense echogenic heterogeneous   appearance. A small amount of ascites is identified along the periphery   of the liver. Evaluation of the pancreas demonstrates a low attenuating   mass-like  area within the region of the tail of the pancreas measuring   3.72 x 2.65 x 3.24 cm. This finding is poorly characterized on ultrasound   and further evaluation with CT and/or MRI is recommended. The pancreas   otherwise is poorly visualized. There does not appear to be evidence of   peripancreatic free fluid. Evaluation of the gallbladder fossa     demonstrates multiple gallstones within the gallbladder which are mobile.   There is no evidence of a sonographic Murphy's sign, gallbladder wall   thickening, nor common bile duct dilation. Gallbladder wall thickness is  1.4 mm and the common bile duct measures 4.2 mm in diameter. Adjacent to   the spleen in the  left upper quadrant is a partially complex primary   cystic appearing mass measuring 8.94 x 6.11 x 5.96 centimeters. This   finding may represent a spleniccyst raising suspicion of prior trauma or   possibly infection. Again, further evaluation with CT or MRI is   recommended.    IMPRESSION:      1. Hepatic steatosis.  2. Findings suspicious for a mass within the region of the tail of the   pancreas.  3. Cystic area adjacent to the spleen partially characterized.  4. Further evaluation with CT or MRI is recommended.    Thank you for the opportunity to contribute to the care of your patient.         Verified By: Mikki Santee, M.D., MD  CT:    23-Aug-14 11:06, CT Abdomen With Contrast  CT Abdomen With Contrast   REASON FOR EXAM:    pancreatic mass;    NOTE: Nursing to Give Oral CT   Contrast  COMMENTS:       PROCEDURE: CT  - CT ABDOMEN STANDARD W  - Jun 15 2013 11:06AM     RESULT: The patient underwent abdominal ultrasound examination on 14 June 2013. Atthat time a questionable pancreatic tail mass was   demonstrated.    Axial CT scanning was performed through the abdomen with reconstructions   at 3 mm intervals and slice thicknesses. The patient was unable to   tolerate more than a small amount of oral contrast material. The patient   did receive 100 cc of Isovue 300 intravenously.  There is an abnormal cystic appearing mass associated with the posterior   aspect of the body of the stomach. This measures 8.8 cm in greatest   dimension and has athickened rind. This may be in fact arising from the   pancreatic tail and involving the adjacent stomach. There are smaller   similar appearing cystic masses in the body and head of the pancreas. The   pancreatic body mass measures 2.8 cm in diameter. Two pancreatic head   cystic appearing masses are demonstrated and measure 1.6 and 1 cm   respectively. There is mildly increased density in the peripancreatic fat   with small  amounts of peripancreatic fluid. The findings are consistent   with both acute and chronic pancreatitis.    The liver exhibits profoundly decreased density consistent with fatty   infiltration. The gallbladder is adequately distended and contains an   approximately 2 cm diameter faintly rim calcified stone. There is no   pericholecystic fluid or gallbladder wall thickening. The spleen is     normal in size. The adrenal glands and kidneys are normal in appearance.    The caliber of the abdominal aorta is normal. There is no periaortic nor  pericaval lymphadenopathy. The observed portions of the small and large   bowel exhibit no evidence of ileus or obstruction or acute inflammation.   A normal calibered, uninflamed-appearing appendix is demonstrated.    There are small to moderate sized bilateral pleural effusions layering   posteriorly. There is bibasilar atelectasis as well. The cardiac chambers   where visualized appear normal. The lumbar vertebral bodies are preserved   in height.    IMPRESSION:   1. There are multiple cystic appearing masses within the pancreas as   described. 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 The largest is associated with the pancreatic tail   and extends into the posterior aspect of the body of the stomach. There   is mildly increased peripancreatic fat density presently and there are   small amounts of peripancreatic fluid.  2. There is fatty infiltrative change of the liver. No hepatic masses are   demonstrated.  3. The gallbladder is mildly distended and contains a nearly 2 cm   diameter faintly rim calcified stone.  4. There is no acute urinary tract abnormality nor acute bowel   abnormality.  5. There are small to moderate sized bilateral pleural effusions with   bibasilar atelectasis.     Dictation Site: 5    Verified By: DAVID A. Martinique, M.D., MD   Assessment/Plan:  Assessment/Plan:   Assessment 1) pancreatitis, acute on chronic/recurrent.  likely etoh related.  evidence of  multiple pancreatic cysts, pseudocysts versus cystic neoplasms on [place] and CT. Most likely the former.  triglycerides normal.  2) etoh use/abuse 3) h/o celiac sprue.   Plan 1) awaiting results of tumor markers, ca19-9 and cea.  Would recommend Endoscopic ultrasound for further definition of the pancreatic cystic structures.   2) currently was started on regular diet, would change this to full liquids for today and advance to low residue as tolerated tomorrow.   3) counselled etoh cessation 4) per patients family, patient may be moving back to [place] soon and may not be able to do GI fu of pancreas as outlined.  I would recommend she contact her physicians in [place], take copies of our notes, labs and imaging studies for followup.   Electronic Signatures: Loistine Simas (MD)  (Signed 24-Aug-14 15:09)  Authored: Chief Complaint, VITAL SIGNS/ANCILLARY NOTES, Brief Assessment, Lab Results, Radiology Results, Assessment/Plan   Last Updated: 24-Aug-14 15:09 by Loistine Simas (MD)

## 2015-02-13 NOTE — Consult Note (Signed)
PATIENT NAME:  Martha Preston, Martha Preston MR#:  [id number] DATE OF BIRTH:  1961/09/08  DATE OF CONSULTATION:  06/22/2013  REFERRING PHYSICIAN:   CONSULTING PHYSICIAN:  Lauris Serviss K. Guss Bundehalla, MD  PLACE OF DICTATION:  [facility] on Lower Level Floor.   AGE:  54 years.  SEX:  Female.  RACE:  White.  SUBJECTIVE:  The patient was seen in consultation .in Lower Level.  The patient is a 54 year old white female who was seen by Dr. Garnetta BuddyFaheem and needs followup due to prolonged withdrawal and persistent confusion from alcohol withdrawal.  Staff reports that the patient had been going through alcohol withdrawal and detox since 06/13/2013.  She is still confused.  This morning she was walking away to get a 3862-month-old baby.  According to information obtained her brother came to visit and he stated that patient and husband live in [place] and husband is going through detox in [place] and that they came to live here and husband was taken back to [place].  The patient is a poor historian and very confused.  She stated she is 54 years old and she does not work and she is divorced.  She said that this was a hospital and when asked what hospital she said "[facility] [place]."  When she was asked a state, she said "[place]."  OBJECTIVE:   The patient was seen lying in bed and appears comfortable, alert and pleasant, but very confused.  She said the year was 2002 and she was in [facility]  in [place].  She denies feeling depressed.  She could count money.  She had four quarters, 10 dimes, but 25 nickels which was wrong and 100 pennies in a dollar.  She denies feeling depressed.  Memory and recall are poor.  She could not remember any of the three objects she was asked to remember after a few minutes and after several minutes.  Regarding fire, she stated she did not know.  Insight and judgment appear to be guarded versus impaired at this time.  IMPRESSION:  Alcohol dependence, chronic, continuous with withdrawal and  confusion with psychosis.   RECOMMENDATIONS:  Continue high levels of thiamin and multivitamins along with medications for agitation which is Haldol 1 mg by mouth or IM q. 6 hours as needed for agitation along with Ativan 1 mg by mouth or IM q. 6 hours as needed for agitation.     ____________________________ Jannet MantisSurya K. Guss Bundehalla, MD skc:ea D: 06/22/2013 19:32:38 ET T: 06/22/2013 23:07:09 ET JOB#: [id number]  cc: Monika SalkSurya K. Guss Bundehalla, MD, <Dictator> Beau FannySURYA K Jayceion Lisenby MD ELECTRONICALLY SIGNED 06/23/2013 17:38

## 2015-02-13 NOTE — Consult Note (Signed)
PATIENT NAME:  Martha Preston, Gurtha MR#:  [id number] DATE OF BIRTH:  [birth year]  DATE OF CONSULTATION:  06/20/2013  REFERRING PHYSICIAN:  Dr. Clerance LavPadmaja Vasireddy. CONSULTING PHYSICIAN:  Burnis Halling S. Garnetta BuddyFaheem, MD  REASON FOR CONSULTATION:  Confusion.   HISTORY OF PRESENT ILLNESS:  The patient is a 54 year old married female who is originally from [place], [place], admitted through the Emergency Room for confusion. The patient was also experiencing excruciating abdominal pain at the time of her admission. Her lipase was elevated at 1133. The patient complained of epigastric pain and was not having any nausea, vomiting at that time. She was also found to be confused with hallucinations and delusions. Her blood alcohol level was 60 at the time of her admission. Per nursing staff, the patient's husband was also intoxicated with alcohol level at the time of admission. Her urine drug screen was positive for opiates. The patient was admitted to the hospital for acute pancreatitis.    A psychiatric consult was obtained for acute mental status changes. During my interview, the patient was noted to be sitting in the bed. She reported that she came here to see her husband, who is on some other floor in the hospital. She reported that she is originally from [place] and she is currently in [place] as well. She reported that she is not going to [place] and that [place] [address] [place]. She remained focused on [place] throughout her interview. She stated that she is going to work outside [place] and then will be at the border of the [place]. She had no recollection of being in a hospital in [place] and does not know that she is in [place] at this time. She stated that she [place] sometimes. When I asked her why she is in [place], she said that which part of [place] I am talking about. She has some friends who lives in [place], but she is currently in  [place]. She remained focused on that throughout the interview. She appeared to be delusional and responding to internal stimuli. The staff also reported that she is very intrusive and goes to other patient's room all the time. She is unable to control her behavior. The patient had a similar admission in June 2014, when she became delusional, hallucinating and was unable to control herself after she was intoxicated.   PAST PSYCHIATRIC HISTORY:  The patient has a history of anxiety and is currently taking BuSpar 22.5 mg. She also on clonazepam 0.5 mg every 8 hours as needed.   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   ALLERGIES:  CEPHALOSPORINS, PENICILLIN, SULFA.   HOME MEDICATIONS:  Protonix, Oxybutynin 1 tablet twice daily, omeprazole 40 mg daily, Detrol LA 4 mg once a day, Colace 100 mg b.i.d., clonazepam 0.5 mg every 8 hours as needed, BuSpar 22.5 mg b.i.d.   SOCIAL HISTORY:  The patient is married and currently lives with her husband. She stated that they have moved from [place] a year ago according to her records. However, she remained focused on [place] throughout her interview and was very difficult to redirect.   FAMILY HISTORY:  Positive for migraine headaches, hyperlipidemia and panic attacks in the family.   REVIEW OF SYSTEMS:  Unable to obtain as the patient is currently responding to internal stimuli and has been hallucinating.   VITAL SIGNS:  Temperature 98.5, pulse 87, respirations 18, blood pressure 86/55.  LABORATORY, DIAGNOSTIC, AND RADIOLOGICAL DATA:  Glucose 118, BUN 1, creatinine 0.62, sodium 143, potassium 3.9, chloride 110, bicarbonate 25, anion gap greater than 60, anion gap 8, osmolality 282, calcium 8.4, lipase 116. Blood alcohol level was 68 at the time of her admission. Protein 5.6, albumin 1.7, bilirubin 1.1, alkaline phosphatase 241, AST 69, ALT 31. Urine drug  screen was positive for opioids at the time of her admission. WBC 4.6, RBC 1.91, hemoglobin 8.3, hematocrit 24, platelet count 177, MCV is 125, MCH is 43.2, RDW is 15.5.   MENTAL STATUS EXAMINATION:  The patient is a thinly built female who was sitting in the bed. She was all dressed up and had her pocketbook next to her, and she maintained fair eye contact. Her speech was rambling. Her mood was anxious. Affect was congruent. Thought process was tangential. Thought content was delusional and she was responding to internal stimuli. She was unable to provide any meaningful history. She demonstrated poor insight and judgment.   DIAGNOSTIC IMPRESSION:  AXIS I:  1.  Delirium.                2.  Alcohol withdrawal.                 3.  Mood disorder, not otherwise specified.    TREATMENT PLAN:  1.  I will start the patient on Seroquel 25 mg p.o. t.i.d. to help with her acute paranoia at this time.  2.  I will also give her low dose of Librium 0.5 mg p.o. b.i.d. for the next three days to help with the withdrawals from benzodiazepines and alcohol as she might be going through prolonged withdrawals at this time.  3.  She will also continue on BuSpar 10 mg p.o. t.i.d. at this time. Please reconsult if patient does not improve in the next couple of days.   Thank you for allowing me to participate in the care of this patient.   ____________________________ Ardeen Fillers. Garnetta Buddy, MD usf:jm D: 06/20/2013 15:33:18 ET T: 06/20/2013 16:30:53 ET JOB#: [id number]  cc: Ardeen Fillers. Garnetta Buddy, MD, <Dictator> Rhunette Croft MD ELECTRONICALLY SIGNED 06/27/2013 13:54

## 2015-02-13 NOTE — Discharge Summary (Signed)
PATIENT NAME:  Martha Preston, Martha Preston MR#:  [id number] DATE OF BIRTH:  Mar 11, 1961  DATE OF ADMISSION:  03/29/2013 DATE OF DISCHARGE:  04/02/2013  PRIMARY CARE PHYSICIAN: Nonlocal.    CONSULTATIONS: GI, Dr. Mechele Collin. General surgery, Dr. Excell Seltzer.   PROCEDURE: Endoscopy.   CONDITION: Stable.   CODE STATUS: FULL CODE.   HOME MEDICATIONS:  1. Colace 100 mg p.o. b.i.d. p.r.n.  2. Buspirone 7.5 mg p.o. b.i.d.   3. Protonix 40 mg p.o. daily.  4. Xanax 0.5 mg p.o. every 8 hours p.r.n. for 7 days.   DIET: Low-fat, low-cholesterol diet.   ACTIVITY: As tolerated.   FOLLOWUP CARE: Follow with PCP within 1 to 2 weeks. Follow up with Dr. Mechele Collin within 1 week. Also, the patient needs outpatient PT and a rolling walker according to physical therapy evaluation's recommendation.   FINAL DIAGNOSES:  1. Acute pancreatitis.  2. Gastritis.  3. Alcohol hepatitis.  4. Cholelithiasis.  5. Hypotension.  6. Hypokalemia.  7. Anemia.  8. Thrombocytopenia.  9. Alcohol abuse.  10. Tobacco abuse.   REASON FOR ADMISSION: Abdominal pain.   HOSPITAL COURSE: The patient is a 54 year old Caucasian female with a history of panic attacks, anxiety, vertigo, hyperlipidemia and dizziness. Presented to the ED with abdominal pain for a while. Started to develop severe abdominal pain in the upper abdomen, exacerbated with eating, radiating into her back. The patient denied any hematemesis or hematochezia. The patient had increasing fatigue and weakness. She was found to have hepatitis in the pattern of alcoholic liver disease and pancreatitis. The patient admitted that she drinks a lot of alcohol. For detailed history and physical examination, please refer to the admission note dictated by Dr. Winona Legato. Laboratory data on admission date showed glucose 150, BUN 5, potassium 2.9, chloride 96, magnesium 1.4. Lipase 1138. Bilirubin 1.9. SGPT 115. SGOT 344. Abdominal ultrasound showed cholelithiasis. A hepatitis panel was negative.  1.  Acute pancreatitis: Possibly acute on chronic alcoholic pancreatitis. After admission, the patient was kept n.p.o. with IV fluid support, and lipase level has been trending down to about 400. The patient's abdominal pain, nausea and vomiting have been improving.  2. Alcoholic pancreatitis: The patient's liver function tests have been improving after admission. SGOT decreased to 124. SGPT decreased to 50.  3. Gastritis: Since the patient has abdominal pain with pancreatitis and also the patient has a history of alcohol abuse, we requested GI physician consult. Dr. Mechele Collin did endoscopy which showed gastritis. The patient has been treated with Protonix.  4. For alcohol abuse, the patient has been placed on CIWA protocol with thiamine, vitamin B and folic acid. No sign of withdrawal.   5. Hypokalemia and hypomagnesemia: Possibly due to pancreatitis, nausea, vomiting. The patient has been treated with potassium and magnesium supplements. Potassium today is 3.4 and magnesium 1.5. The patient was treated with IV magnesium and p.o. potassium today. The patient needs a followup BMP as outpatient.  6. Hypotension: The patient has low blood pressure since admission. She feels weak and dizzy. She has been treated with normal saline support. Blood pressure has been around 90 to 100.  7. Anemia: The patient's hemoglobin was 13.0 on admission date but decreased to 8.5 which is possibly due to fluid dilution, but since the patient has positive stool occult, the patient underwent EGD which showed gastritis. The patient's hemoglobin today is 9.4. The patient denies any melena or bloody stool.  8. Weakness: The patient has been treated with physical therapy. According to physical therapy's evaluation, the patient  needs outpatient physical therapy and rolling walker.  9. Thrombocytopenia: The patient's platelets were 249 on admission but gradually decreased to 139, possibly due to alcohol.  10. Urine frequency and urgency:  The patient complains of urine frequency and urgency. Urinalysis is negative. Kidneys by ultrasound showed no acute intra-abdominal abnormality.   The patient is clinically is stable. Her vital signs are stable. Physical examination is unremarkable. She will be discharged to home with outpatient physical therapy and rolling walker. I discussed the patient's discharge plan with the patient, case manager and nurse.   TIME SPENT: About 40 minutes.   ____________________________ Shaune PollackQing Airyana Sprunger, MD qc:gb D: 04/02/2013 20:46:38 ET T: 04/02/2013 22:37:14 ET JOB#: [id number]  cc: Shaune PollackQing Abigial Newville, MD, <Dictator> Shaune PollackQING Donyale Berthold MD ELECTRONICALLY SIGNED 04/03/2013 15:28

## 2015-02-13 NOTE — Consult Note (Signed)
Pt consult seen.  CC epigastric pain, vomiting, pancreatitis, alcoholism, celiac disease. Plan probable EGD Monday.  Electronic Signatures: Scot JunElliott, Robert T (MD)  (Signed on 07-Jun-14 15:43)  Authored  Last Updated: 07-Jun-14 15:43 by Scot JunElliott, Robert T (MD)

## 2015-02-13 NOTE — H&P (Signed)
PATIENT NAME:  Martha Preston, Martha Preston MR#:  [id number] DATE OF BIRTH:  1961-10-08  DATE OF ADMISSION:  06/25/2013  REFERRING PHYSICIAN:  Dr. Clerance Lav Vasireddy  ATTENDING PHYSICIAN: Kristine Linea, M.D.   IDENTIFYING DATA: Martha Preston is a 54 year old female with a history of alcoholism.   CHIEF COMPLAINT: The patient unable to state.   HISTORY OF PRESENT ILLNESS: Martha Preston has a long history of alcoholism. She is able to provide only limited information on her history, but apparently she has been drinking most of her life with very little or no sobriety whatsoever. She relocated to [facility] several months ago. She has been drinking steadily. Since her relocation to [place], she was hospitalized on the medical floor twice for alcoholic pancreatitis. Last time, she was admitted on August 21st. She underwent alcohol detox and was medically ready for discharge, but the patient continued to have paranoia, delusions,  hallucinations. She believes that she is in [place] at her parent's place. She does not realize she is in the hospital. She thinks that she just had a visit from her father or that she just went to lunch with a dear friend from church. I saw her a couple of times on the medical floor, and there was very little improvement. At times, the patient was agitated. She was started on low-dose antipsychotic. 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 The patient reportedly does not have any history of mental illness. He has never been hospitalized. Has not been taking any medications. Other than alcohol, she uses no drugs.   PAST PSYCHIATRIC HISTORY: As above, long history of alcoholism. She denies any substance abuse treatment. She complains of severe anxiety for which she was prescribed BuSpar and clonazepam.   FAMILY PSYCHIATRIC HISTORY: None reported.   PAST MEDICAL HISTORY: Dyslipidemia, alcoholic pancreatitis.   ALLERGIES: CEPHALOSPORINS, PENICILLIN, SULFA DRUGS.    MEDICATIONS AT THE TIME OF TRANSFER FROM MEDICAL FLOOR:  Protonix 40 mg daily, BuSpar 7.5 mg twice daily, oxybutynin 10 mg daily.   SOCIAL HISTORY: She is married. She lives with her husband. 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 She still drives a care. Takes care of cooking, shopping. She and her family confirm that was fully functional prior to coming to the hospital, which I find hard to believe. She used to work at the [employer]. I am not certain if she is still employed. She has [employer], which her husband does not have, maybe it indicates that she has been employed recently.   REVIEW OF SYSTEMS:  CONSTITUTIONAL: No fever or chills. Positive for weight loss, at least 8 pounds, possibly 18, depending on how the patient answered the question about her regular weight.  EYES: No double or blurred vision.  EARS, NOSE, THROAT: No hearing loss.  RESPIRATORY: No shortness of breath or cough.  CARDIOVASCULAR: No chest pain or orthopnea.  GASTROINTESTINAL: Positive for still diarrhea.  GENITOURINARY: No incontinence or frequency.  ENDOCRINE: No heat or cold intolerance.  LYMPHATIC: No anemia or easy bruising.  INTEGUMENTARY: No acne or rash.  MUSCULOSKELETAL: No muscle or joint pain.  NEUROLOGIC: No tingling or weakness.  PSYCHIATRIC: See history of present illness for details.   PHYSICAL EXAMINATION: VITAL SIGNS: Blood pressure 89/63, pulse 112, respirations 20, temperature 99.5.  GENERAL: This is a slender, petite female in no acute distress.  HEENT: The pupils are equal, round and reactive to light. Sclerae anicteric.  NECK: Supple. No thyromegaly.  LUNGS: Clear to auscultation. No dullness to percussion.  HEART: Regular rhythm and rate. No murmurs, rubs or gallops.  ABDOMEN: Soft, nontender, nondistended. Positive bowel sounds.  MUSCULOSKELETAL: Normal muscle strength in all extremities.  SKIN: No rashes or bruises.  LYMPHATIC: No cervical  adenopathy.  NEUROLOGIC: Cranial nerves II through XII are intact.   LABORATORY DATA: Chemistries are within normal limits except for potassium of 3.4. Plasma ammonia less than 25. LFT: Total protein 5.6, albumin 1.7, bilirubin 1.1, alkaline phosphatase 241, AST 69 ALT 31. CBC: White blood count 4.8, RBC 2.35, hemoglobin 9.8, hematocrit 28.8, platelets 189. Urinalysis is not suggestive of urinary tract infection. Blood in feces negative.   MENTAL STATUS EXAMINATION ON ADMISSION: The patient is alert. She is oriented to person only.  She is pleasant, polite and cooperative. She is well groomed and casually dressed. She maintains good eye contact. Her speech is soft. Mood is fine with anxious affect. Thought process is disorganized. It is quite possible that she confabulates. She gives me unlikely answers to  simple questions, but also she believes that she is in [place]. She denies thoughts of hurting herself or others. She is delusional. She has visual hallucinations. Her cognition is impaired. Her insight and judgment are extremely poor.   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 She is in no shape to plan or execute a suicide attempt but requires assistance and supervision.   INITIAL DIAGNOSES:  AXIS I: Alcohol-induced psychosis, alcohol dependence.  AXIS II: Deferred.  AXIS III: Pancreatitis, dyslipidemia.  AXIS IV: Mental illness, substance abuse.  AXIS V: Global Assessment of Functioning 25.   PLAN: The patient was admitted to [facility] behavioral medicine unit for safety, stabilization and medication management. She was initially placed on suicide precautions and was closely monitored for any unsafe behaviors. She underwent full psychiatric and risk assessment. She received pharmacotherapy, individual and group psychotherapy, substance abuse  counseling and support from therapeutic milieu.  1.  Psychosis: The patient is on Risperdal. We will gradually increase her dose.  2.  Anxiety: She was taking Klonopin at home. She completed alcohol detox. I will not prescribe benzodiazepines, as they may worsen cognition, at least not for now.  3.  We will continue Protonix.  4.  Collateral data:  We will talk to her family.   DISPOSITION: She will be discharged to home.     ____________________________ Ellin GoodieJolanta B. Jennet MaduroPucilowska, MD jbp:dmm D: 06/26/2013 20:16:58 ET T: 06/26/2013 21:26:38 ET JOB#: [id number]  cc: Gal Feldhaus B. Jennet MaduroPucilowska, MD, <Dictator> Shari ProwsJOLANTA B Kline Bulthuis MD ELECTRONICALLY SIGNED 07/02/2013 6:30

## 2015-02-13 NOTE — H&P (Signed)
PATIENT NAME:  Martha Preston, Martha Preston MR#:  [id number] DATE OF BIRTH:  1960-12-03  DATE OF ADMISSION:  06/13/2013  PRIMARY CARE PHYSICIAN: None.   REFERRING PHYSICIAN: Maurilio Lovely, MD  CHIEF COMPLAINT: Confusion and abdominal pain.   HISTORY OF PRESENT ILLNESS: Martha Preston is a 54 year old female who has history of heavy alcohol abuse, who is brought to the Emergency Department for confusion. The patient also states that has been experiencing abdominal pain. Concerning this, lipase was obtained which showed 1130. The patient states has mild pain in the epigastric area. No nausea or vomiting. The patient was also found to be confused with hallucinations and delusions. Alcohol level of 60. Per Nursing staff, the patient's husband was also intoxicated with alcohol at the time of the presentation. Could not obtain any history from the patient as well as from the family. Urine drug screen is positive for opiates. CT head without contrast: No acute intracranial abnormality. The patient had a similar admission in June 2014.   PAST MEDICAL HISTORY:  1. Hypertension.  2. Hyperlipidemia.  3. Panic attacks.  4. Vertigo.   PAST SURGICAL HISTORY:  1. Partial hysterectomy.  2. Breast tumor on the right which was benign.   ALLERGIES: CEPHALOSPORINS, PENICILLINS, SULFA.   HOME MEDICATIONS:  1. Protonix 40 mg once a day.  2. Oxybutynin 1 tablet once a day.  3. Omeprazole 40 mg once a day.  4.  25 mg once a day.  5. Detrol LA 4 mg once a day.  6. Colace 100 mg 2 times a day.  7. Clonazepam 0.5 mg every 8 hours as needed.  8. Buspirone 22.5 mg 2 times a day.   SOCIAL HISTORY: Continues to smoke 1 pack a day and drinks 6 to 8 shots of alcohol daily. Married, lives with her husband. The patient states that they have moved from [place] about a year back.   FAMILY HISTORY: History of migraine headaches, hyperlipidemia, died of heart attack in her 57s. Also panic attacks in the family.   REVIEW OF SYSTEMS: Unable  to obtain as the patient has been experiencing hallucinations and tangential with the information. However, found to be negative. Denied having any shortness of breath. Denied having any nausea, vomiting or diarrhea. No rash or lesions. No joint pains and aches.   PHYSICAL EXAMINATION:  GENERAL: This is a thin-built, looks much older than her stated age, lying down in the bed, not in distress.  VITAL SIGNS: Temperature 98.5, pulse 86, blood pressure 86/55, respiratory rate of 16, oxygen saturation is 98% on room air.  HEENT: Head normocephalic, atraumatic. Eyes: No sclerae icterus. Conjunctivae normal. Pupils equal and reactive to light. Extraocular movements are intact. Mucous membranes moist. No pharyngeal erythema.  NECK: Supple. No lymphadenopathy. No JVD. No carotid bruit.  CHEST: Has no focal tenderness.  LUNGS: Bilaterally clear to auscultation.  HEART: S1 and S2 regular. No murmurs are heard. No pedal edema. Pulses 2+.  ABDOMEN: Bowel sounds present. Soft. Mild tenderness in the epigastric area. No rebound or guarding. No hepatosplenomegaly.  SKIN: No rash or lesions.  MUSCULOSKELETAL: Good range of motion in all the extremities.  NEUROLOGIC: The patient is alert, oriented to self, place, but not to time. No apparent cranial nerve abnormalities. Moving all 4 extremities. I did not examine the sensory.   LABORATORY DATA: Ammonia level is less than 23. CT head without contrast shows atrophy, with chronic microvascular ischemic changes. No acute abnormalities noted. Urine drug screen is positive for opiates. UA negative  for nitrites and leukocyte esterase. CBC is completely within normal limits. BMP is completely within normal limits. The patient has elevated liver enzymes, total bilirubin of 2.3, alkaline phosphatase of 348, AST 137, ALT 42, total protein 6.9, albumin of 2.2.   ASSESSMENT AND PLAN: Martha Preston is a 54 year old female who comes to the Emergency Department with acute pancreatitis.    1. Acute pancreatitis, alcohol induced. Will continue with IV fluids. The patient is not in any pain. Will continue to watch the patient. If develops any pain, will consider adding the opiates at that time.  2. Hypotension. The cause is uncertain; however, the patient responded well to the fluids. Initial blood pressure was 70s.  3. Delirium, concern about the patient has underlying dementia with possible psychosis. Also concern about possible withdrawal. Admit the patient to the stepdown unit of the CCU considering the patient's hypotension as well as delirium and follow up. No obvious signs of any infection.  4. Elevated liver enzymes secondary to alcohol use.  5. Alcohol abuse. Keep the patient on thiamine. Keep the patient on CIWA protocol.  6. Keep the patient on deep vein thrombosis prophylaxis with Lovenox.   TIME SPENT: 45 minutes.   ____________________________ Susa GriffinsPadmaja Dory Verdun, MD pv:OSi D: 06/14/2013 06:00:36 ET T: 06/14/2013 06:41:40 ET JOB#: [id number]  cc: Susa GriffinsPadmaja Charletta Voight, MD, <Dictator> Susa GriffinsPADMAJA Jamani Bearce MD ELECTRONICALLY SIGNED 06/26/2013 22:12

## 2015-02-13 NOTE — Consult Note (Signed)
CC: abd pain.  Pt VSS afebrile, nurses report pt tearful at times.  i discussed EGD with her. plan for tomorrow.  BP border line low at times.  chest clear.  WBC 4.7, hgb 9.8, folate normal, Mg 2, lipase down to 426.  Plan EGD for tomorrow for abd pain.  Electronic Signatures: Scot JunElliott, Robert T (MD)  (Signed on 08-Jun-14 09:34)  Authored  Last Updated: 08-Jun-14 09:34 by Scot JunElliott, Robert T (MD)

## 2015-02-13 NOTE — Discharge Summary (Signed)
PATIENT NAME:  Martha Preston, Martha Preston MR#:  [id number] DATE OF BIRTH:  February 13, 1961  DATE OF ADMISSION:  06/13/2013 DATE OF DISCHARGE:  06/25/2013  The patient was discharged to behavioral health unit.  DISCHARGE DIAGNOSES: 1.  Resolved acute pancreatitis.  2.  Alcohol abuse.  3.  Delirium. 4.  History of urinary frequency with overactive bladder.  5.  History of celiac sprue.   DISCHARGE MEDICATIONS: BuSpar 10 mg t.i.d., Seroquel 25 mg t.i.d., Ensure. She is also on oxybutynin 10 mg daily.  CONSULTATIONS: Psychiatry consult and GI consult.   HOSPITAL COURSE: The patient is a 54 year old female patient admitted on August 21 because of pancreatitis. The patient had confusion and abdominal pain. This 54 year old female patient moved from [place], admitted for acute pancreatitis. Lipase was 1130 on admission. The patient had abdominal pain. The patient was kept n.p.o., IV fluids were started. Initially she was admitted to CCU stepdown because of her hypotension and also confusion. Her pancreatitis resolved. Possibly secondary to alcohol use. The patient has been drinking heavily after she moved from [place], and the patient's husband also is a heavy drinker. The patient's pancreatitis improved with fluids and pain medicines and n.p.o. status. Lipase normalized. The patient's CBC was within normal limits. Alcohol level on admission was 61 and LFTs were within normal limits. CT head was done for confusion, which was negative for any acute changes. Ammonia level also was less than 25 on admission. Alcoholism and pancreatitis resolved. However, the patient was seen by GI. Her alk phos level was elevated at 239.   ct abdomen showed   1. There are multiple cystic appearing masses within the pancreas as  described. 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 The largest is associated with the pancreatic tail  and extends into the posterior aspect of the body of the stomach.  There  is mildly increased peripancreatic fat density presently and there are  small amounts of peripancreatic fluid. 2. There is fatty infiltrative change of the liver. No hepatic masses are  demonstrated. 3. The gallbladder is mildly distended and contains a nearly 2 cm  diameter faintly rim calcified stone. 4. There is no acute urinary tract abnormality nor acute bowel  abnormality. 5. There are small to moderate sized bilateral pleural effusions with  bibasilar atelectasis.    The patient was seen by GI. The patient's CA 19-9 is slightly elevated at 196. CEA levels are 8.7. The patient's cystic lesions were thought to be pseudocysts, but unable to exclude cystic neoplasm with elevated CA 19-9 and CEA levels. The patient needed further evaluation once her withdrawal symptoms resolved, but the patient's family is in [place] and husband also in [place]. She wanted to have further work-up in [place]. GI has signed off on the case. Especially the patient was still having confusion. The patient's family wanted to have further work-up in [place] as well. pt needs EUS.  EtOH abuse and withdrawal: The patient had prolonged withdrawal with confusion episodes,  required IV banana bag with thiamine and folic acid. Also placed on CIWA protocol. She is still confused and having some hallucinations. Seen by psychiatrist, and Dr. Gretel Acre started her on Seroquel and BuSpar. The patient was also seen by Dr. Franchot Mimes and also Dr. Bary Leriche. The patient continued to have these confusion episodes. Dr. Bary Leriche recommended the patient to be transferred to behavioral health unit. The patient went to [facility] yesterday.   I discussed with the patient's family over the phone, brother Araceli Bouche  and Gordon's wife. I spoke with them and discussed that patient is going to [facility].   The patient also has h/o Over active bladder;. She also has urine frequency. We started her back on oxybutynin.   Celiac sprue: She is on gluten-free diet.    After she is discharged from psych, family wanted to take her to [place] and she can have further work-up regarding her pancreatic mass. To evaluate, she may need endoscopic ultrasound and further work-up.   TIME SPENT ON DISCHARGE PREPARATION: More than 30 minutes.   ____________________________ Epifanio Lesches, MD sk:jm D: 06/26/2013 13:23:00 ET T: 06/26/2013 14:40:50 ET JOB#: [id number]  cc: Epifanio Lesches, MD, <Dictator> Epifanio Lesches MD ELECTRONICALLY SIGNED 07/11/2013 23:15

## 2015-02-13 NOTE — Consult Note (Signed)
PATIENT NAME:  Martha Preston, Martha Preston MR#:  [id number] DATE OF BIRTH:  1961-01-24  DATE OF CONSULTATION:  06/15/2013  CONSULTING PHYSICIAN:  Christena DeemMartin U. Tayen Narang, MD  ADDENDUM: This is a continuation.  ASSESSMENT:  2.  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 This could be sequelae of recurrent pancreatitis with small pseudocyst versus cystic neoplasia. Whether these cysts are communicating with the pancreatic duct is a question. The positioning of the one adjacent to the gastric body would be more consistent with a pancreatic pseudocyst.   RECOMMENDATIONS: 1.  Continue treatment of possible withdrawal and altered mental status as you are.  2.  Will obtain tumor marker studies in regards to pancreatic cancer, although these may not be as accurate as hoped due to her history of alcohol abuse and possible chronic/intermittent pancreatitis. Will obtain a CA 19-9 and CEA. Will follow with you. Further recommendations to follow.   ____________________________ Christena DeemMartin U. Kynzleigh Bandel, MD mus:jm D: 06/15/2013 13:10:16 ET T: 06/15/2013 13:45:13 ET JOB#: [id number]  cc: Christena DeemMartin U. Marily Konczal, MD, <Dictator> Christena DeemMARTIN U Isaura Schiller MD ELECTRONICALLY SIGNED 07/04/2013 20:23

## 2015-02-13 NOTE — Consult Note (Signed)
PATIENT NAME:  Martha Preston, Martha Preston MR#:  [id number] DATE OF BIRTH:  01-27-1961  DATE OF CONSULTATION:  03/30/2013  CONSULTING PHYSICIAN:  Scot Jun, MD  HISTORY OF PRESENT ILLNESS: The patient is a 54 year old white female who was admitted to the hospital because of abdominal pain, alcohol liver disease and pancreatitis. I was asked to see her in consultation.   The patient and her husband have moved to [facility] in November of last year and things have not worked out for them. It has become very stressful for them both and they both have been drinking significant amounts of alcohol. The patient drinks at least 6 drinks a day of hard liquor, usually mixing Bacardi rum and Gatorade.   She reports abdominal pain in the lower abdomen that may be relieved by moving her bowels. She also has epigastric pain radiating into the back and it usually starts about 30 minutes after eating. She has complained mostly of constipation but sometimes has problems with diarrhea. She does take stool softeners and rare laxatives.   The patient has a history of celiac disease, supposedly diagnosed with endoscopy. She has been following her diet somewhat, but since moving to this area has not been on a gluten-free diet. Her ancestors are from [place].   PAST MEDICAL HISTORY: Hyperlipidemia, panic attacks, vertigo and dizziness.   MEDICATIONS: Clonazepam 0.5 mg every 8 hours as needed, Dulcolax daily, docusate twice a day, ibuprofen p.r.n., buspirone 7.5 mg twice a day.  PAST SURGICAL HISTORY: Hysterectomy, benign breast tumor on the right.   ALLERGIES: CEPHALOSPORINS, PENICILLIN AND SULFA.   FAMILY HISTORY: Positive for heart disease in father.   HABITS: The patient smokes 1/2 pack to 1-1/2 packs a day for 30 years.   REVIEW OF SYSTEMS: Positive for fatigue, stress, anxiety, panic attacks, nausea, vomiting, abdominal pain, lightheadedness, dizziness, presyncopal spells from time to time.   PHYSICAL  EXAMINATION: GENERAL: White female in no acute distress.  VITAL SIGNS: Temperature 98.3, pulse 90, respirations 20, blood pressure 90/62, O2 sat 96% on room air.  HEENT: Sclerae anicteric. Conjunctivae negative. Tongue negative. Head is atraumatic.  NECK: Trachea is in the midline.  CHEST: Clear.  HEART: No murmurs or gallops I can hear.  ABDOMEN: Bowel sounds present, somewhat diminished. There is some mild tenderness in the right upper quadrant. Liver seems to be somewhat enlarged by percussion, maybe 1 to 2 fingerbreadths.  EXTREMITIES: No edema.  SKIN: Warm and dry.  PSYCHIATRIC: Affect is somewhat flat. Mood is somewhat depressed.   LABORATORY DATA: Glucose 82, BUN 7, creatinine 0.44, sodium 140, potassium 3.6, chloride 106, CO2 of 26, calcium 7.3, magnesium 2.8, lipase 1138 on admission, 609 on repeat today, total protein 5, albumin 1.9,  total bilirubin 2.9, alkaline phosphatase 252, SGOT 200, SGPT 72. White count 5.2, hemoglobin 9, hematocrit 25.5, platelet count 134, MCV of 120. Urine: 2+ bilirubin 1+ ketones and protein.   ASSESSMENT: Abdominal pain, epigastric area after eating and right upper quadrant: Certainly could be peptic ulcer disease. Could be alcohol gastritis or duodenitis. She supposed has celiac disease and has been off her diet. She could have duodenal inflammation from this. This could be contributing some to her abdominal problems. Her heavy consumption of alcohol could be giving her alcohol pancreatitis, as evidenced by her elevated lipase and epigastric discomfort. Her pain pattern does not strongly suggest pancreatitis at this time. Her primary constipation alternating with loose stools would fit for irritable bowel.   RECOMMENDATIONS: 1.  PPI therapy with  pantoprazole 40 mg IV q.12 hours. I will change her from a pill once a day to b.i.d. therapy.  2.  Suggest upper endoscopy for evaluation of her upper abdominal pain, nausea and vomiting, with biopsy of the duodenum  to see how her celiac disease is doing.  3.  Recommend alcohol rehab versus AA.  4.  Will talk to patient and her family tomorrow about upper endoscopy for Monday. I will follow with you.    ____________________________ Scot Junobert T. Betsie Peckman, MD rte:jm D: 03/30/2013 15:41:05 ET T: 03/30/2013 16:38:44 ET JOB#: [id number]  cc: Scot Junobert T. Camira Geidel, MD, <Dictator> Shaune PollackQing Chen, MD Scot JunOBERT T Adien Kimmel MD ELECTRONICALLY SIGNED 04/03/2013 7:15

## 2015-02-13 NOTE — Consult Note (Signed)
Brief Consult Note: Diagnosis: Alcohol dependence.   Patient was seen by consultant.   Recommend further assessment or treatment.   Orders entered.   Discussed with Attending MD.   Comments: [name] was admitted for alcoholic pancreatitis. She is difficult to interview as she is rather confused and changes her story constantly.  PLAN: 1. UA no longer suggestive of UTI.  2. i will attempt interview tomorrow.  Electronic Signatures: Kristine LineaPucilowska, Jolanta (MD)  (Signed 01-Sep-14 18:05)  Authored: Brief Consult Note   Last Updated: 01-Sep-14 18:05 by Kristine LineaPucilowska, Jolanta (MD)

## 2015-02-13 NOTE — H&P (Signed)
PATIENT NAME:  Martha Preston, Martha Preston MR#:  [id number] DATE OF BIRTH:  [birth year]  DATE OF ADMISSION:  03/29/2013  PRIMARY CARE PHYSICIAN: Dr. Sherryll BurgerShah from unknown clinic.   HISTORY OF PRESENT ILLNESS:  The patient is a 54 year old Caucasian female with past medical history significant for history of panic attacks, history of vertigo, dizziness, hyperlipidemia, who presents to the hospital with complaints of abdominal pains. According to patient, she has been having abdominal pain for a while now, for a few months now on and off. She also feels intermittently constipated as well as having problems with diarrhea. Today, however, she started having severe abdominal pain which is described in the upper abdomen, a 5 to 10 out of 10  in intensity, intermittent pain, increasing with eating and usually starts within 30 minutes after she eats, radiating to her back. She also has some suprapubic pain and she did not have any bowel movements for the past one week. However, on arrival to the Emergency Room, she had an episode of diarrhea and now she feels much better in regards to her suprapubic pain. She denies any hematemesis or hematochezia. However, admits of having intermittent nausea and vomiting and feeling presyncopal. She has been dizzy and presyncopal for the past few weeks. She denies any weight loss. She stays stable at around 104 pounds. She admits of having increasing fatigue and weakness. Because of that, she decided to come to the Emergency Room for further evaluation where she was found to have hepatitis in the pattern of alcoholic liver disease and that she was also found to have pancreatitis. She admits to drinking plenty of alcohol. She drinks at least six shots of alcohol a day and she starts drinking early in the morning whenever she wakes up.   PAST MEDICAL HISTORY: Significant for history of hyperlipidemia, panic attacks, vertigo, dizziness.  MEDICATIONS:  Clonazepam 0.5 mg every 8 hours as needed, Dulcolax  5 mg daily as needed, docusate 100 mg p.o. twice daily as needed, ibuprofen as needed, buspirone 7.5 mg p.o. twice a day.  PAST SURGICAL HISTORY:  History of partial hysterectomy as well as breast tumor on the right, operated, which was benign.   ALLERGIES: CEPHALOSPORINS, PENICILLIN, SULFA.   FAMILY HISTORY: Negative for early coronary artery disease, diabetes mellitus, hypertension. The patient's family had migraines, the patient's mother had hyperlipidemia. The patient's father died of heart attack at age of 54, started having problems with heart at the age of 54.  Also panic attacks in the family.   SOCIAL HISTORY: The patient is married and has two children who are 7330 and 54 years old;  one [place] in [place] came from, patient admits smoking approximately a pack and a half a day for the past 30 years. She drinks approximately six drinks a day, usually hard liquor.  She drinks shortly as soon as she wakes up.   REVIEW OF SYSTEMS:  On arrival to the hospital, during review of systems, she admits of fatigue and weakness, pains in her abdomen, macular degeneration, seasonal allergies, some sinus congestion, feeling dizziness, lightheadedness as well as  presyncopal especially whenever she is upright or gets up from sitting or lying position suddenly.  Nausea and vomiting intermittently as well as intermittent diarrhea or constipation as well as urge incontinence.   CONSTITUTIONAL: Denies any high fevers or chills, weight loss or gain.   EYES: Denies any blurry vision, double vision, glaucoma or cataracts.   ENT: Denies tinnitus, except as mentioned above, no epistaxis,  sinus pain, dentures, difficulty swallowing.    CARDIOVASCULAR: Denies any chest pain, arrhythmias, or palpitations.   GASTROINTESTINAL:   Denies any hematemesis or rectal bleeding, change in bowel habits.   GENITOURINARY: Denies dysuria, hematuria, frequency.  ENDOCRINE:  Denies any polydipsia. No thyroid problems, heat  or cold intolerance or thirst.  HEMATOLOGY:  Denies any anemia, easy bruising or bleeding or swollen glands.  SKIN: Denies any acne or change in moles.   MUSCULOSKELETAL: Denies arthritis, cramps, swelling.   NEUROLOGIC: No numbness, epilepsy or tremor.   PSYCHIATRIC: The patient denies anxiety, insomnia or depression.   PHYSICAL EXAMINATION: VITAL SIGNS: On arrival to the hospital, temperature is 98.5, pulse 118, respirations was 20, blood pressure 90/49, saturation was 100% on room air.   GENERAL: This is thin, even cachectic, Caucasian female lying on the stretcher.   HEENT: Her pupils are equal, reactive to light.  Extraocular movements intact. No icterus or conjunctivitis.  Has normal hearing. No pharyngeal erythema. Mucosa is moist.   NECK: No masses. Supple. No adenopathy. No JVD or carotid bruits bilaterally.  Full range of motion.   LUNGS: Clear to auscultation in all fields. No rales, rhonchi, diminished breath sounds,   no labored inspirations, increased effort, dullness to percussion or overt respiratory distress.   CARDIOVASCULAR: S1, S2 appreciated. No murmurs, rubs or gallops, PMI is not lateralized.  Chest is nontender to palpation. Pedal pulses 1+. No lower extremity edema, clubbing or cyanosis.   ABDOMEN: Soft, tender diffusely, more in the upper abdomen, in the epigastria as well as right upper quadrant, some guarding was felt in the right upper quadrant as well as epigastrium area.  Positive guarding but no rebound was noted. Mild hepatosplenomegaly was noted and tenderness but smooth liver was felt on palpation.   MUSCLE STRENGTH: Able to move extremities, however, has difficulty even sitting up in bed. No cyanosis, mild kyphosis. Gait is not tested.   SKIN: No skin rashes, lesions, erythema, nodularity or induration. It was warm and dry to palpation.   LYMPHATIC: No adenopathy in the cervical region.   NEUROLOGICAL: Cranial nerves grossly intact. Sensory is  intact. No dysarthria or aphasia.   PSYCHIATRIC: The patient is alert and oriented to person, place, cooperative. Memory is good. No significant confusion, agitation or depression noted.   LABORATORY DATA: BMP revealed a glucose to 150, BUN 5, potassium 2.9, chloride 96, calcium 8.4, magnesium 1.4. Lipase (Dictation Anomaly)1138. Alcohol level was 0.010.  The patient's albumin level of 2.8, total bilirubin 2.8, bilirubin 1.9, alkaline phosphatase 380, AST 344, ALT 115. CBC: White blood cell count 8.5, hemoglobin 13.0, platelet count 249, MCV is high at 117.   RADIOLOGIC STUDIES: Ultrasound of abdomen is still pending. EKG is not done.   ASSESSMENT AND PLAN: 1. Questionable acute on chronic alcoholic pancreatitis. Admit the patient to the medical floor. Keep her n.p.o., we will continue the patient on intravenous fluids at a high rate. Follow up patient's lipase level in the morning. 2. History of alcoholic hepatitis.  Supportive therapy. We will get viral hepatitis panel, will get ultrasound of abdomen and will ask gastroenterologist to see patient for further recommendations and follow patient's liver enzymes in the morning.  3. Hypertension. We will continue fluids at a high rate intravenous, very likely dehydration-related.  4. Hypokalemia, hypomagnesemia likely due to intermittent diarrhea.  Will supplement with IV.    5. Alcohol abuse. The patient was starting to go through withdrawal. We will start patient on folic acid as  well as thiamine. 6. Tobacco abuse. Nicotine replacement therapy will be initiated. This was discussed with patient for approximately 5 to 10 minutes.   TIME SPENT:  1 hour.    ____________________________ Katharina Caper, MD rv:rw D: 03/29/2013 16:12:00 ET T: 03/29/2013 16:42:17 ET JOB#: [id number]  cc: Outside Physician Katharina Caper, MD, <Dictator>  Shawnette Augello MD ELECTRONICALLY SIGNED 05/03/2013 17:29

## 2015-02-13 NOTE — Consult Note (Signed)
CC: abd pain RUQ and epigastric area.  Pt EGD showed gastritis mostly of body,  No varices. Duodenum normal.  Recommend start full liquids.  Needs alcohol rehab.  Await celiac panel.  Electronic Signatures: Scot JunElliott, Robert T (MD)  (Signed on 09-Jun-14 10:50)  Authored  Last Updated: 09-Jun-14 10:50 by Scot JunElliott, Robert T (MD)

## 2015-02-13 NOTE — Consult Note (Signed)
Brief Consult Note: Diagnosis: epig abd [pain.   Patient was seen by consultant.   Consult note dictated.   Recommend further assessment or treatment.   Comments: gastritis, alcoholism, gallstones, pancreatitis DDX: gallstone/biliary tract disease anxiety/ETOH/gastritis primary liver disease  plan follow as gastritis is treated; may need LC if findings of GB dz pwersist after adequate treatment.  Electronic Signatures: Lattie Hawooper, Chairty Toman E (MD)  (Signed 09-Jun-14 15:51)  Authored: Brief Consult Note   Last Updated: 09-Jun-14 15:51 by Lattie Hawooper, Amish Mintzer E (MD)
